# Patient Record
Sex: Female | Born: 1995 | Race: Black or African American | Hispanic: No | Marital: Single | Smoking: Current every day smoker
Health system: Southern US, Community
[De-identification: ages and names within clinical notes are randomized; demographics above are authoritative.]

## PROBLEM LIST (undated history)

## (undated) DIAGNOSIS — A6000 Herpesviral infection of urogenital system, unspecified: Secondary | ICD-10-CM

## (undated) DIAGNOSIS — F32A Depression, unspecified: Secondary | ICD-10-CM

## (undated) DIAGNOSIS — F329 Major depressive disorder, single episode, unspecified: Secondary | ICD-10-CM

## (undated) HISTORY — PX: TONSILLECTOMY: SUR1361

---
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
## 2017-06-13 ENCOUNTER — Emergency Department
Admission: EM | Admit: 2017-06-13 | Discharge: 2017-06-14 | Disposition: A | Discharge status: Home / Self Care | Payer: Medicaid Other | Attending: Emergency Medicine | Admitting: Emergency Medicine

## 2017-06-13 ENCOUNTER — Other Ambulatory Visit: Payer: Self-pay

## 2017-06-13 ENCOUNTER — Emergency Department: Payer: Medicaid Other

## 2017-06-13 ENCOUNTER — Encounter: Payer: Self-pay | Admitting: Emergency Medicine

## 2017-06-13 DIAGNOSIS — Z79899 Other long term (current) drug therapy: Secondary | ICD-10-CM | POA: Diagnosis not present

## 2017-06-13 DIAGNOSIS — F17210 Nicotine dependence, cigarettes, uncomplicated: Secondary | ICD-10-CM | POA: Diagnosis not present

## 2017-06-13 DIAGNOSIS — F329 Major depressive disorder, single episode, unspecified: Secondary | ICD-10-CM | POA: Insufficient documentation

## 2017-06-13 DIAGNOSIS — R079 Chest pain, unspecified: Secondary | ICD-10-CM | POA: Diagnosis present

## 2017-06-13 DIAGNOSIS — N644 Mastodynia: Secondary | ICD-10-CM | POA: Diagnosis not present

## 2017-06-13 HISTORY — DX: Major depressive disorder, single episode, unspecified: F32.9

## 2017-06-13 HISTORY — DX: Depression, unspecified: F32.A

## 2017-06-13 HISTORY — DX: Herpesviral infection of urogenital system, unspecified: A60.00

## 2017-06-13 LAB — BASIC METABOLIC PANEL
ANION GAP: 7 (ref 5–15)
BUN: 14 mg/dL (ref 6–20)
CALCIUM: 9.4 mg/dL (ref 8.9–10.3)
CO2: 27 mmol/L (ref 22–32)
Chloride: 106 mmol/L (ref 101–111)
Creatinine, Ser: 0.82 mg/dL (ref 0.44–1.00)
GFR calc non Af Amer: >60 mL/min (ref >60)
Glucose, Bld: 95 mg/dL (ref 65–99)
Potassium: 3.8 mmol/L (ref 3.5–5.1)
Sodium: 140 mmol/L (ref 135–145)

## 2017-06-13 LAB — CBC
HCT: 40.7 % (ref 35.0–47.0)
HEMOGLOBIN: 13.1 g/dL (ref 12.0–16.0)
MCH: 27.4 pg (ref 26.0–34.0)
MCHC: 32.2 g/dL (ref 32.0–36.0)
MCV: 85.3 fL (ref 80.0–100.0)
Platelets: 237 10*3/uL (ref 150–440)
RBC: 4.77 MIL/uL (ref 3.80–5.20)
RDW: 14.2 % (ref 11.5–14.5)
WBC: 11.8 10*3/uL — ABNORMAL HIGH (ref 3.6–11.0)

## 2017-06-13 LAB — TROPONIN I

## 2017-06-13 LAB — POCT PREGNANCY, URINE: PREG TEST UR: NEGATIVE

## 2017-06-13 NOTE — ED Notes (Signed)
Pt reports hx of nipple pain for 4 days with redness and swelling - pain with shower water hitting nipples, 10/10 "cutting" pain, LMP April 10, denies birth control takes IBU and valacyclovir daily for herpes outbreak in vagina, pt with occasional unprotected sex,   Pt reports CP for the last 2-3 months, worse with palpation, but no pain att  PT WOULD LIKE WORK NOTE

## 2017-06-13 NOTE — ED Triage Notes (Addendum)
Pt reports chest pain for 2-3 months but started worsening 4 days ago; pain to the center of her chest; pt also reports pain and swelling to both nipples for several weeks; no redness noted; denies drainage; pt has had unprotected intercourse since her LMP in April; pt talking in complete coherent sentences

## 2017-06-14 LAB — TROPONIN I: Troponin I: <0.03 ng/mL (ref <0.03)

## 2017-06-14 MED ORDER — FAMOTIDINE 40 MG PO TABS: 40.0000 mg | ORAL_TABLET | Freq: Every evening | ORAL | 0 refills | Status: DC

## 2017-06-14 MED ORDER — ACETAMINOPHEN 325 MG PO TABS
650.0000 mg | ORAL_TABLET | Freq: Once | ORAL | Status: AC
  Administered 2017-06-14: 650 mg via ORAL
  Filled 2017-06-14: qty 2

## 2017-06-14 MED ORDER — GI COCKTAIL ~~LOC~~
30.0000 mL | Freq: Once | ORAL | Status: AC
  Administered 2017-06-14: 30 mL via ORAL
  Filled 2017-06-14: qty 30

## 2017-06-14 MED ORDER — SUCRALFATE 1 G PO TABS
1.0000 g | ORAL_TABLET | Freq: Once | ORAL | Status: AC
  Administered 2017-06-14: 1 g via ORAL
  Filled 2017-06-14: qty 1

## 2017-06-14 NOTE — ED Provider Notes (Signed)
[facility] Emergency Department Provider Note   ____________________________________________   First MD Initiated Contact with Patient 06/13/17 2335     (approximate)  I have reviewed the triage vital signs and the nursing notes.   HISTORY  Chief Complaint Chest Pain    HPI Maria Raymond is a 22 y.o. female who comes into the hospital today with some chest pain and nipple swelling and pain.  She reports that the chest pain has been going on for months intermittently but the nipple swelling has been for the past 4 to 5 days.  The patient has been taking ibuprofen 800 mg with no relief.  She reports that her nipples hurt even when she takes a shower.  She denies any discharge or drainage from her nipples.  She states that her menstrual cycle is due to begin on May 10.  She states that with the chest pain she does not have any radiation although she has low back pain as well.  The patient states that her back pain is in her low to mid back.  She denies any shortness of breath and denies any pain is worse with breathing.  The patient states that she is also use ice packs to her nipples.  She is just unable to sleep.  She left work to come in for evaluation.  She is concerned because there is a family history of breast cancer.  The patient endorses some nausea and states the chest pain is worse when she lays down.  The patient rates her pain 8-10 out of 10 in intensity she is here today for evaluation.   Past Medical History:  Diagnosis Date  . Depression   . Genital herpes     There are no active problems to display for this patient.   Past Surgical History:  Procedure Laterality Date  . TONSILLECTOMY      Prior to Admission medications   Medication Sig Start Date End Date Taking? Authorizing Provider  ibuprofen (ADVIL,MOTRIN) 800 MG tablet Take 800 mg by mouth every 8 (eight) hours as needed.   Yes [provider]  valACYclovir (VALTREX) 1000 MG  tablet Take 1,000 mg by mouth daily.   Yes [provider]  famotidine (PEPCID) 40 MG tablet Take 1 tablet (40 mg total) by mouth every evening. 06/14/17 06/14/18  Rebecka Apley, MD    Allergies Patient has no known allergies.  History reviewed. No pertinent family history.  Social History Social History   Tobacco Use  . Smoking status: Current Every Day Smoker    Packs/day: 0.25    Types: Cigarettes  . Smokeless tobacco: Never Used  Substance Use Topics  . Alcohol use: Yes  . Drug use: Never    Review of Systems  Constitutional: No fever/chills Eyes: No visual changes. ENT: No sore throat. Cardiovascular:  chest pain. Respiratory: Denies shortness of breath. Gastrointestinal: No abdominal pain.  No nausea, no vomiting.  No diarrhea.  No constipation. Genitourinary: Negative for dysuria. Musculoskeletal: Negative for back pain. Skin: Nipple pain Neurological: Negative for headaches, focal weakness or numbness.   ____________________________________________   PHYSICAL EXAM:  VITAL SIGNS: ED Triage Vitals [06/13/17 2050]  Enc Vitals Group     BP 133/78     Pulse Rate 85     Resp 17     Temp 98.8 F (37.1 C)     Temp Source Oral     SpO2 100 %     Weight 180 lb (  81.6 kg)     Height  (1.651 m)     Head Circumference      Peak Flow      Pain Score 7     Pain Loc      Pain Edu?      Excl. in GC?     Constitutional: Alert and oriented. Well appearing and in mild distress. Eyes: Conjunctivae are normal. PERRL. EOMI. Head: Atraumatic. Nose: No congestion/rhinnorhea. Mouth/Throat: Mucous membranes are moist.  Oropharynx non-erythematous. Cardiovascular: Normal rate, regular rhythm. Grossly normal heart sounds.  Good peripheral circulation. Respiratory: Normal respiratory effort.  No retractions. Lungs CTAB. Gastrointestinal: Soft and nontender. No distention.  Positive bowel sounds Musculoskeletal: No lower extremity tenderness nor edema.     Neurologic:  Normal speech and language.  Skin: No breast masses palpated some mild tenderness over the nipples with no active discharge. Psychiatric: Mood and affect are normal.   ____________________________________________   LABS (all labs ordered are listed, but only abnormal results are displayed)  Labs Reviewed  CBC - Abnormal; Notable for the following components:      Result Value   WBC 11.8 (*)    All other components within normal limits  BASIC METABOLIC PANEL  TROPONIN I  TROPONIN I  POC URINE PREG, ED  POCT PREGNANCY, URINE   ____________________________________________  EKG  ED ECG REPORT I, Rebecka Apley, the attending physician, personally viewed and interpreted this ECG.   Date: 06/13/2017  EKG Time: 2047  Rate: 88  Rhythm: normal sinus rhythm  Axis: normal  Intervals:none  ST&T Change: none  ____________________________________________  RADIOLOGY  ED MD interpretation: Chest x-ray: No active cardiopulmonary disease  Official radiology report(s): Dg Chest 2 View  Result Date: 06/13/2017 CLINICAL DATA:  Chest pain. EXAM: CHEST - 2 VIEW COMPARISON:  None. FINDINGS: The heart size and mediastinal contours are within normal limits. Both lungs are clear. No pneumothorax or pleural effusion is noted. The visualized skeletal structures are unremarkable. IMPRESSION: No active cardiopulmonary disease. Electronically Signed   By: Lupita Raider, M.D.   On: 06/13/2017 21:31    ____________________________________________   PROCEDURES  Procedure(s) performed: None  Procedures  Critical Care performed: No  ____________________________________________   INITIAL IMPRESSION / ASSESSMENT AND PLAN / ED COURSE  As part of my medical decision making, I reviewed the following data within the electronic MEDICAL RECORD NUMBER Notes from prior ED visits and [place] Controlled Substance Database   This is a 22 year old female who comes into the hospital today  with some chest pain and nipple pain.  My differential diagnosis includes gastritis, acute coronary syndrome, pneumonia, pregnancy, PMS.  We did check some blood work to include a CBC BMP troponin and a pregnancy test.  The patient's blood work is unremarkable and the pregnancy is negative.  We did repeat her troponin which was also negative.  I discussed with the patient that this could be due to hormones or could be an early pregnancy that is not yet detected by urine.  The patient had a chest x-ray as well which was negative.  We discussed that the chest pain could be gastritis and I did give her a GI cocktail and Pepcid.  The patient symptoms improved and she is ready to be discharged home.  The patient will be discharged to follow-up with her primary care physician.  I encouraged the patient to repeat her pregnancy test at home in 1 week to determine if she is pregnant.  ____________________________________________   FINAL CLINICAL IMPRESSION(S) / ED DIAGNOSES  Final diagnoses:  Chest pain, unspecified type  Breast tenderness in female     ED Discharge Orders        Ordered    famotidine (PEPCID) 40 MG tablet  Every evening     06/14/17 0040       Note:  This document was prepared using Dragon voice recognition software and may include unintentional dictation errors.    Rebecka Apley, MD 06/14/17 [phone]

## 2017-06-14 NOTE — Discharge Instructions (Addendum)
Please follow up with your primary care physician for further evaluation of your chest and breast pain. Please return with any other concerns

## 2018-01-10 ENCOUNTER — Encounter: Payer: Self-pay | Admitting: Physician Assistant

## 2018-01-10 ENCOUNTER — Emergency Department: Payer: Medicaid Other

## 2018-01-10 ENCOUNTER — Emergency Department
Admission: EM | Admit: 2018-01-10 | Discharge: 2018-01-10 | Disposition: A | Discharge status: Home / Self Care | Payer: Medicaid Other | Attending: Emergency Medicine | Admitting: Emergency Medicine

## 2018-01-10 ENCOUNTER — Other Ambulatory Visit: Payer: Self-pay

## 2018-01-10 DIAGNOSIS — Y939 Activity, unspecified: Secondary | ICD-10-CM | POA: Diagnosis not present

## 2018-01-10 DIAGNOSIS — Y929 Unspecified place or not applicable: Secondary | ICD-10-CM | POA: Diagnosis not present

## 2018-01-10 DIAGNOSIS — Y998 Other external cause status: Secondary | ICD-10-CM | POA: Insufficient documentation

## 2018-01-10 DIAGNOSIS — F17210 Nicotine dependence, cigarettes, uncomplicated: Secondary | ICD-10-CM | POA: Insufficient documentation

## 2018-01-10 DIAGNOSIS — S93402A Sprain of unspecified ligament of left ankle, initial encounter: Secondary | ICD-10-CM | POA: Insufficient documentation

## 2018-01-10 DIAGNOSIS — W182XXA Fall in (into) shower or empty bathtub, initial encounter: Secondary | ICD-10-CM | POA: Diagnosis not present

## 2018-01-10 DIAGNOSIS — S99912A Unspecified injury of left ankle, initial encounter: Secondary | ICD-10-CM | POA: Diagnosis present

## 2018-01-10 DIAGNOSIS — Z79899 Other long term (current) drug therapy: Secondary | ICD-10-CM | POA: Insufficient documentation

## 2018-01-10 DIAGNOSIS — S90122A Contusion of left lesser toe(s) without damage to nail, initial encounter: Secondary | ICD-10-CM | POA: Diagnosis not present

## 2018-01-10 MED ORDER — NABUMETONE 750 MG PO TABS: 750.0000 mg | ORAL_TABLET | Freq: Two times a day (BID) | ORAL | 0 refills | Status: DC

## 2018-01-10 NOTE — ED Triage Notes (Signed)
PT in with co left ankle pain for months, unsure of injury. Pt states worse when she stands or walks.

## 2018-01-10 NOTE — Discharge Instructions (Signed)
You have a normal exam and x-ray. You have a high ankle sprain. Wear the ace bandage as needed for support. Rest, ice,and elevate the foot when seated. Take the prescription pain medicine as needed. Follow-up with podiatry for ongoing symptoms.

## 2018-01-10 NOTE — ED Notes (Signed)

## 2018-01-14 NOTE — ED Provider Notes (Signed)
[facility] Emergency Department Provider Note ____________________________________________  Time seen: 2110  I have reviewed the triage vital signs and the nursing notes.  HISTORY  Chief Complaint  Ankle Pain  HPI Maria Raymond is a 22 y.o. female presents to the ED with complaints of left ankle pain for months.  Patient is unsure of any recent injury, accident, trauma.  She describes pain is worsened when she stands or walks for prolonged periods.  She does admit to remote injury where she slipped and fell in the shower.  She has had some pain and swelling to the anterior shin that is been persistent for the last several weeks.  She denies any history of chronic or ongoing ankle or joint pain.  Past Medical History:  Diagnosis Date  . Depression   . Genital herpes     There are no active problems to display for this patient.   Past Surgical History:  Procedure Laterality Date  . TONSILLECTOMY      Prior to Admission medications   Medication Sig Start Date End Date Taking? Authorizing Provider  famotidine (PEPCID) 40 MG tablet Take 1 tablet (40 mg total) by mouth every evening. 06/14/17 06/14/18  Rebecka ApleyWebster, Allison P, MD  ibuprofen (ADVIL,MOTRIN) 800 MG tablet Take 800 mg by mouth every 8 (eight) hours as needed.    [provider]  nabumetone (RELAFEN) 750 MG tablet Take 1 tablet (750 mg total) by mouth 2 (two) times daily. 01/10/18   Donyae Kohn, Charlesetta IvoryJenise V Bacon, PA-C  valACYclovir (VALTREX) 1000 MG tablet Take 1,000 mg by mouth daily.    [provider]    Allergies Patient has no known allergies.  No family history on file.  Social History Social History   Tobacco Use  . Smoking status: Current Every Day Smoker    Packs/day: 0.25    Types: Cigarettes  . Smokeless tobacco: Never Used  Substance Use Topics  . Alcohol use: Yes  . Drug use: Never    Review of Systems  Constitutional: Negative for fever. Cardiovascular: Negative for  chest pain. Respiratory: Negative for shortness of breath. Musculoskeletal: Negative for back pain.  Left ankle pain as above. Skin: Negative for rash. Neurological: Negative for headaches, focal weakness or numbness. ____________________________________________  PHYSICAL EXAM:  VITAL SIGNS: ED Triage Vitals  Enc Vitals Group     BP 01/10/18 1937 (!) 113/95     Pulse Rate 01/10/18 1937 (!) 108     Resp 01/10/18 1937 20     Temp 01/10/18 1937 98.9 F (37.2 C)     Temp Source 01/10/18 1937 Oral     SpO2 01/10/18 1937 100 %     Weight 01/10/18 1938 160 lb (72.6 kg)     Height 01/10/18 1938 5\' 5"  (1.651 m)     Head Circumference --      Peak Flow --      Pain Score 01/10/18 1938 8     Pain Loc --      Pain Edu? --      Excl. in GC? --     Constitutional: Alert and oriented. Well appearing and in no distress. Head: Normocephalic and atraumatic. Eyes: Conjunctivae are normal.  Normal extraocular movements Cardiovascular: Normal rate, regular rhythm. Normal distal pulses. Respiratory: Normal respiratory effort.  Musculoskeletal: Ankle without any obvious deformity, dislocation, or joint effusion.  Patient localizes some tenderness and mild bruising to the anterior lower shin.  There is an area of some soft tissue swelling  that may indicate a local injury.  Otherwise the ankle exam benign without any indication of internal derangement.  Negative anterior/posterior drawer.  No significant calf or Achilles tenderness is appreciated.  Knee exam is also reassuring.  Nontender with normal range of motion in all extremities.  Neurologic:  Normal gait without ataxia. Normal speech and language. No gross focal neurologic deficits are appreciated. Skin:  Skin is warm, dry and intact. No rash noted. ____________________________________________   RADIOLOGY  Left Ankle IMPRESSION: Negative. ____________________________________________  PROCEDURES  Procedures Ace  wrap ____________________________________________  INITIAL IMPRESSION / ASSESSMENT AND PLAN / ED COURSE  Patient with ED evaluation of chronic intermittent left ankle pain.  Patient exam is overall benign without any signs of internal derangement to the left ankle.  She may have a local injury to the upper anterior shin consistent with a local contusion.  She is reassured overall by her exam and x-ray findings.  She placed in Ace bandage for support, and reports improvement of her symptoms with the Ace in place.  Patient is discharged with prescription for Relafen to take as directed patient is also advised to rest, ice, elevate the foot as needed.  She will follow-up with podiatry for ongoing symptom management. ____________________________________________  FINAL CLINICAL IMPRESSION(S) / ED DIAGNOSES  Final diagnoses:  Sprain of left ankle, unspecified ligament, initial encounter  Contusion of fifth toe of left foot, initial encounter      Lissa Hoard, PA-C 01/14/18 0043    Arnaldo Natal, MD 01/14/18 [phone]

## 2018-02-14 ENCOUNTER — Encounter: Payer: Self-pay | Admitting: Intensive Care

## 2018-02-14 ENCOUNTER — Other Ambulatory Visit: Payer: Self-pay

## 2018-02-14 ENCOUNTER — Emergency Department
Admission: EM | Admit: 2018-02-14 | Discharge: 2018-02-14 | Disposition: A | Discharge status: Home / Self Care | Payer: Medicaid Other | Attending: Emergency Medicine | Admitting: Emergency Medicine

## 2018-02-14 DIAGNOSIS — F17210 Nicotine dependence, cigarettes, uncomplicated: Secondary | ICD-10-CM | POA: Insufficient documentation

## 2018-02-14 DIAGNOSIS — Z79899 Other long term (current) drug therapy: Secondary | ICD-10-CM | POA: Diagnosis not present

## 2018-02-14 DIAGNOSIS — J101 Influenza due to other identified influenza virus with other respiratory manifestations: Secondary | ICD-10-CM | POA: Insufficient documentation

## 2018-02-14 DIAGNOSIS — R05 Cough: Secondary | ICD-10-CM | POA: Diagnosis present

## 2018-02-14 DIAGNOSIS — J111 Influenza due to unidentified influenza virus with other respiratory manifestations: Secondary | ICD-10-CM

## 2018-02-14 MED ORDER — BENZONATATE 100 MG PO CAPS: ORAL_CAPSULE | ORAL | 0 refills | Status: DC

## 2018-02-14 MED ORDER — FLUTICASONE PROPIONATE 50 MCG/ACT NA SUSP: 2.0000 | Freq: Every day | NASAL | 0 refills | Status: DC

## 2018-02-14 MED ORDER — ONDANSETRON 4 MG PO TBDP: 4.0000 mg | ORAL_TABLET | Freq: Three times a day (TID) | ORAL | 0 refills | Status: DC | PRN

## 2018-02-14 NOTE — ED Notes (Signed)
Reference triage notes. Pt states fever yesterday was 103. Pt c/o nausea at this time and light-headedness. Pt in NAD at this time. Pt denies abdominal pain presently.

## 2018-02-14 NOTE — ED Triage Notes (Signed)
Patient c/o flu like symptoms of fever, chills, and cough

## 2018-02-14 NOTE — ED Provider Notes (Signed)
[facility] Emergency Department Provider Note ____________________________________________  Time seen: 1953  I have reviewed the triage vital signs and the nursing notes.  HISTORY  Chief Complaint  Influenza  HPI Maria Raymond is a 23 y.o. female to the ED for evaluation of flulike symptoms.  Patient describes 3 days prior she had T-max of 42 F.  She is had intermittent cough, congestion, body aches, and nausea.  She is also noted some diarrhea in the interim.  Patient did not receive the seasonal flu vaccine.  She presents now for evaluation of her symptoms and for confirmation of her presumed flu.  She misses her grandmother was recently diagnosed with the flu, and she was in her grandmother's house over the last 3 days.  Past Medical History:  Diagnosis Date  . Depression   . Genital herpes     There are no active problems to display for this patient.   Past Surgical History:  Procedure Laterality Date  . TONSILLECTOMY      Prior to Admission medications   Medication Sig Start Date End Date Taking? Authorizing Provider  benzonatate (TESSALON PERLES) 100 MG capsule Take 1-2 tabs TID prn cough 02/14/18   Keghan Mcfarren, Charlesetta Ivory, PA-C  famotidine (PEPCID) 40 MG tablet Take 1 tablet (40 mg total) by mouth every evening. 06/14/17 06/14/18  Rebecka Apley, MD  fluticasone (FLONASE) 50 MCG/ACT nasal spray Place 2 sprays into both nostrils daily. 02/14/18   Deray Dawes, Charlesetta Ivory, PA-C  ibuprofen (ADVIL,MOTRIN) 800 MG tablet Take 800 mg by mouth every 8 (eight) hours as needed.    [provider]  nabumetone (RELAFEN) 750 MG tablet Take 1 tablet (750 mg total) by mouth 2 (two) times daily. 01/10/18   Samba Cumba, Charlesetta Ivory, PA-C  ondansetron (ZOFRAN ODT) 4 MG disintegrating tablet Take 1 tablet (4 mg total) by mouth every 8 (eight) hours as needed. 02/14/18   Tytianna Greenley, Charlesetta Ivory, PA-C  valACYclovir (VALTREX) 1000 MG tablet Take 1,000 mg by mouth daily.     [provider]    Allergies Patient has no known allergies.  History reviewed. No pertinent family history.  Social History Social History   Tobacco Use  . Smoking status: Current Every Day Smoker    Packs/day: 0.25    Types: Cigarettes  . Smokeless tobacco: Never Used  Substance Use Topics  . Alcohol use: Yes  . Drug use: Never    Review of Systems  Constitutional: Positive for fever. Eyes: Negative for visual changes. ENT: Negative for sore throat. Cardiovascular: Negative for chest pain. Respiratory: Negative for shortness of breath.  Reports cough as above. Gastrointestinal: Negative for abdominal pain, vomiting and diarrhea. Genitourinary: Negative for dysuria. Musculoskeletal: Negative for back pain.  Reports generalized body aches. Skin: Negative for rash. Neurological: Negative for headaches, focal weakness or numbness. ____________________________________________  PHYSICAL EXAM:  VITAL SIGNS: ED Triage Vitals [02/14/18 1853]  Enc Vitals Group     BP 131/82     Pulse Rate 75     Resp 16     Temp 98.4 F (36.9 C)     Temp Source Oral     SpO2 99 %     Weight 160 lb (72.6 kg)     Height 5\' 5"  (1.651 m)     Head Circumference      Peak Flow      Pain Score 8     Pain Loc      Pain Edu?  Excl. in GC?     Constitutional: Alert and oriented. Well appearing and in no distress. Head: Normocephalic and atraumatic. Eyes: Conjunctivae are normal. PERRL. Normal extraocular movements Nose: No congestion/rhinorrhea/epistaxis. Mouth/Throat: Mucous membranes are moist. Cardiovascular: Normal rate, regular rhythm. Normal distal pulses. Respiratory: Normal respiratory effort. No wheezes/rales/rhonchi. Gastrointestinal: Soft and nontender. No distention. Musculoskeletal: Nontender with normal range of motion in all extremities.  Neurologic:  Normal gait without ataxia. Normal speech and language. No gross focal neurologic deficits are  appreciated. Skin:  Skin is warm, dry and intact. No rash noted. ____________________________________________   LABS (pertinent positives/negatives)  deferred ____________________________________________  PROCEDURES  Procedures ____________________________________________  INITIAL IMPRESSION / ASSESSMENT AND PLAN / ED COURSE  Patient with a clinical picture of symptoms likely consistent with influenza.  Patient is T-max occurred 3 days prior, and she is beyond the window of treatment for Tamiflu.  Patient verbalizes understanding, and is not inclined to wait for influenza testing confirmation given the long wait to get into the room and the delay in getting results.  She is advised that her symptoms are classic for influenza, and treatment is symptomatic at this point.  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  She is discharged with prescriptions for [facility], Flonase, and Zofran.  Return precautions have been reviewed. ____________________________________________  FINAL CLINICAL IMPRESSION(S) / ED DIAGNOSES  Final diagnoses:  Influenza      Karmen Stabs, Charlesetta Ivory, PA-C 02/14/18 2152    Arnaldo Natal, MD 02/15/18 [phone]

## 2018-02-14 NOTE — Discharge Instructions (Addendum)
Your symptoms are consistent with influenza. You should take the prescription meds as directed. Take OTC Delsym for cough relief. Drink plenty of fluids and rest. Consider getting the flu vaccine after your are beyond this current illness. Follow-up with your provider or return as needed.

## 2018-03-22 ENCOUNTER — Encounter: Payer: Self-pay | Admitting: *Deleted

## 2018-03-22 ENCOUNTER — Emergency Department: Payer: Medicaid Other

## 2018-03-22 ENCOUNTER — Emergency Department
Admission: EM | Admit: 2018-03-22 | Discharge: 2018-03-22 | Disposition: A | Discharge status: Home / Self Care | Payer: Medicaid Other | Attending: Emergency Medicine | Admitting: Emergency Medicine

## 2018-03-22 ENCOUNTER — Other Ambulatory Visit: Payer: Self-pay

## 2018-03-22 DIAGNOSIS — W01198A Fall on same level from slipping, tripping and stumbling with subsequent striking against other object, initial encounter: Secondary | ICD-10-CM | POA: Insufficient documentation

## 2018-03-22 DIAGNOSIS — Y92012 Bathroom of single-family (private) house as the place of occurrence of the external cause: Secondary | ICD-10-CM | POA: Diagnosis not present

## 2018-03-22 DIAGNOSIS — Z79899 Other long term (current) drug therapy: Secondary | ICD-10-CM | POA: Diagnosis not present

## 2018-03-22 DIAGNOSIS — S6992XA Unspecified injury of left wrist, hand and finger(s), initial encounter: Secondary | ICD-10-CM | POA: Diagnosis present

## 2018-03-22 DIAGNOSIS — F17210 Nicotine dependence, cigarettes, uncomplicated: Secondary | ICD-10-CM | POA: Diagnosis not present

## 2018-03-22 DIAGNOSIS — Y998 Other external cause status: Secondary | ICD-10-CM | POA: Insufficient documentation

## 2018-03-22 DIAGNOSIS — Y93E1 Activity, personal bathing and showering: Secondary | ICD-10-CM | POA: Insufficient documentation

## 2018-03-22 DIAGNOSIS — S60222A Contusion of left hand, initial encounter: Secondary | ICD-10-CM | POA: Diagnosis not present

## 2018-03-22 NOTE — ED Provider Notes (Signed)
[facility] EMERGENCY DEPARTMENT Provider Note   CSN: [id number] Arrival date & time: 03/22/18  2127     History   Chief Complaint Chief Complaint  Patient presents with  . Hand Injury    HPI Maria Raymond is a 23 y.o. female presents to the emergency department evaluation of left hand pain.  Patient states last night she tripped and fell in the shower injuring her left hand.  She states she hit the palmar aspect of the hand.  She denies any other injury to her body.  She has pain to the third fourth and fifth metacarpal of the left hand.  No numbness or tingling.  No warmth or redness.  HPI  Past Medical History:  Diagnosis Date  . Depression   . Genital herpes     There are no active problems to display for this patient.   Past Surgical History:  Procedure Laterality Date  . TONSILLECTOMY       OB History   No obstetric history on file.      Home Medications    Prior to Admission medications   Medication Sig Start Date End Date Taking? Authorizing Provider  benzonatate (TESSALON PERLES) 100 MG capsule Take 1-2 tabs TID prn cough 02/14/18   Menshew, Charlesetta Ivory, PA-C  famotidine (PEPCID) 40 MG tablet Take 1 tablet (40 mg total) by mouth every evening. 06/14/17 06/14/18  Rebecka Apley, MD  fluticasone (FLONASE) 50 MCG/ACT nasal spray Place 2 sprays into both nostrils daily. 02/14/18   Menshew, Charlesetta Ivory, PA-C  ibuprofen (ADVIL,MOTRIN) 800 MG tablet Take 800 mg by mouth every 8 (eight) hours as needed.    [provider]  nabumetone (RELAFEN) 750 MG tablet Take 1 tablet (750 mg total) by mouth 2 (two) times daily. 01/10/18   Menshew, Charlesetta Ivory, PA-C  ondansetron (ZOFRAN ODT) 4 MG disintegrating tablet Take 1 tablet (4 mg total) by mouth every 8 (eight) hours as needed. 02/14/18   Menshew, Charlesetta Ivory, PA-C  valACYclovir (VALTREX) 1000 MG tablet Take 1,000 mg by mouth daily.    [provider]    Family History No  family history on file.  Social History Social History   Tobacco Use  . Smoking status: Current Every Day Smoker    Packs/day: 0.25    Types: Cigarettes  . Smokeless tobacco: Never Used  Substance Use Topics  . Alcohol use: Yes  . Drug use: Never     Allergies   Patient has no known allergies.   Review of Systems Review of Systems  Constitutional: Negative for fever.  Respiratory: Negative for shortness of breath.   Cardiovascular: Negative for chest pain.  Gastrointestinal: Negative for abdominal pain.  Genitourinary: Negative for urgency.  Musculoskeletal: Positive for arthralgias. Negative for back pain, joint swelling and myalgias.  Skin: Negative for rash.  Neurological: Negative for dizziness and headaches.     Physical Exam Updated Vital Signs BP 132/83 (BP Location: Left Arm)   Pulse 85   Temp 98.4 F (36.9 C) (Oral)   Resp 16   Ht 5\' 5"  (1.651 m)   Wt 81.6 kg   LMP 02/26/2018 (Exact Date)   SpO2 97%   BMI 29.95 kg/m   Physical Exam Constitutional:      Appearance: She is well-developed.  HENT:     Head: Normocephalic and atraumatic.  Eyes:     Conjunctiva/sclera: Conjunctivae normal.  Neck:     Musculoskeletal: Normal range  of motion.  Cardiovascular:     Rate and Rhythm: Normal rate.  Pulmonary:     Effort: Pulmonary effort is normal. No respiratory distress.  Musculoskeletal: Normal range of motion.     Comments: Left hand shows no swelling warmth erythema.  She has full composite fist.  No angulation or rotation of digits.  Mild tenderness along the third fourth and fifth metacarpals.  Skin:    General: Skin is warm.     Findings: No rash.  Neurological:     Mental Status: She is alert and oriented to person, place, and time.  Psychiatric:        Behavior: Behavior normal.        Thought Content: Thought content normal.      ED Treatments / Results  Labs (all labs ordered are listed, but only abnormal results are displayed) Labs  Reviewed - No data to display  EKG None  Radiology Dg Hand Complete Left  Result Date: 03/22/2018 CLINICAL DATA:  Initial evaluation for acute trauma, fall. EXAM: LEFT HAND - COMPLETE 3+ VIEW COMPARISON:  None. FINDINGS: There is no evidence of fracture or dislocation. There is no evidence of arthropathy or other focal bone abnormality. Soft tissues are unremarkable. IMPRESSION: Negative. Electronically Signed   By: Rise Mu M.D.   On: 03/22/2018 22:01    Procedures Procedures (including critical care time)  Medications Ordered in ED Medications - No data to display   Initial Impression / Assessment and Plan / ED Course  I have reviewed the triage vital signs and the nursing notes.  Pertinent labs & imaging results that were available during my care of the patient were reviewed by me and considered in my medical decision making (see chart for details).     23 year old female with left hand contusion.  She will rest ice and elevate left hand.  Work on gentle digit range of motion.  Alternate Tylenol and ibuprofen as needed for pain.  Final Clinical Impressions(s) / ED Diagnoses   Final diagnoses:  Contusion of left hand, initial encounter    ED Discharge Orders    None       Ronnette Juniper 03/22/18 2259    Dionne Bucy, MD 03/23/18 [phone]

## 2018-03-22 NOTE — ED Triage Notes (Signed)
Pt reports fell out of shower last night injuring left hand; ace wrap in place; denies any other c/o or injuries

## 2018-03-22 NOTE — Discharge Instructions (Addendum)
Work on gentle digit range of motion.  Rest ice and elevate left hand.  Take Tylenol and ibuprofen as needed for pain.

## 2018-04-27 ENCOUNTER — Encounter: Payer: Self-pay | Admitting: Emergency Medicine

## 2018-04-27 ENCOUNTER — Other Ambulatory Visit: Payer: Self-pay

## 2018-04-27 ENCOUNTER — Emergency Department
Admission: EM | Admit: 2018-04-27 | Discharge: 2018-04-27 | Disposition: A | Discharge status: Home / Self Care | Payer: Medicaid Other | Attending: Emergency Medicine | Admitting: Emergency Medicine

## 2018-04-27 DIAGNOSIS — F17210 Nicotine dependence, cigarettes, uncomplicated: Secondary | ICD-10-CM | POA: Insufficient documentation

## 2018-04-27 DIAGNOSIS — R112 Nausea with vomiting, unspecified: Secondary | ICD-10-CM

## 2018-04-27 DIAGNOSIS — Z79899 Other long term (current) drug therapy: Secondary | ICD-10-CM | POA: Insufficient documentation

## 2018-04-27 MED ORDER — ONDANSETRON HCL 8 MG PO TABS: 8.0000 mg | ORAL_TABLET | Freq: Three times a day (TID) | ORAL | 0 refills | Status: DC | PRN

## 2018-04-27 MED ORDER — ONDANSETRON 4 MG PO TBDP
4.0000 mg | ORAL_TABLET | Freq: Once | ORAL | Status: AC | PRN
  Administered 2018-04-27: 4 mg via ORAL
  Filled 2018-04-27: qty 1

## 2018-04-27 NOTE — ED Notes (Signed)
Pt N/V for 2 days. Has not been drinking water, only juice and soda. NO diarrhea. PT has been vomiting oral intake. Headache on and off.

## 2018-04-27 NOTE — ED Provider Notes (Signed)
[facility] Emergency Department Provider Note   ____________________________________________   First MD Initiated Contact with Patient 04/27/18 1413     (approximate)  I have reviewed the triage vital signs and the nursing notes.   HISTORY  Chief Complaint Generalized Body Aches    HPI Maria Raymond is a 23 y.o. female patient complain of nausea and body aches.  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  Patient denies diarrhea.  Patient say her body ache is from the vomiting.  Patient denies diarrhea.  Patient has taken flu shot for this season.  Patient did have a positive flu test in January 2020 after taking the shot in November 2019.  Patient status testing today did not feel like the flu.  Patient denies recent travel or contact with any known recent travel.  Patient had a vomiting episode prior to arrival and was given Zofran in triage.          Past Medical History:  Diagnosis Date  . Depression   . Genital herpes     There are no active problems to display for this patient.   Past Surgical History:  Procedure Laterality Date  . TONSILLECTOMY      Prior to Admission medications   Medication Sig Start Date End Date Taking? Authorizing Provider  benzonatate (TESSALON PERLES) 100 MG capsule Take 1-2 tabs TID prn cough 02/14/18   Menshew, Charlesetta Ivory, PA-C  famotidine (PEPCID) 40 MG tablet Take 1 tablet (40 mg total) by mouth every evening. 06/14/17 06/14/18  Rebecka Apley, MD  fluticasone (FLONASE) 50 MCG/ACT nasal spray Place 2 sprays into both nostrils daily. 02/14/18   Menshew, Charlesetta Ivory, PA-C  ibuprofen (ADVIL,MOTRIN) 800 MG tablet Take 800 mg by mouth every 8 (eight) hours as needed.    [provider]  nabumetone (RELAFEN) 750 MG tablet Take 1 tablet (750 mg total) by mouth 2 (two) times daily. 01/10/18   Menshew, Charlesetta Ivory, PA-C  ondansetron (ZOFRAN ODT) 4 MG  disintegrating tablet Take 1 tablet (4 mg total) by mouth every 8 (eight) hours as needed. 02/14/18   Menshew, Charlesetta Ivory, PA-C  ondansetron (ZOFRAN) 8 MG tablet Take 1 tablet (8 mg total) by mouth every 8 (eight) hours as needed for nausea or vomiting. 04/27/18   Joni Reining, PA-C  valACYclovir (VALTREX) 1000 MG tablet Take 1,000 mg by mouth daily.    [provider]    Allergies Patient has no known allergies.  No family history on file.  Social History Social History   Tobacco Use  . Smoking status: Current Every Day Smoker    Packs/day: 0.25    Types: Cigarettes  . Smokeless tobacco: Never Used  Substance Use Topics  . Alcohol use: Yes  . Drug use: Never    Review of Systems Constitutional: No fever/chills Eyes: No visual changes. ENT: No sore throat. Cardiovascular: Denies chest pain. Respiratory: Denies shortness of breath. Gastrointestinal: No abdominal pain.  Nausea and vomiting.  No diarrhea.  No constipation. Genitourinary: Negative for dysuria. Musculoskeletal: Negative for back pain. Skin: Negative for rash. Neurological: Negative for headaches, focal weakness or numbness.   ____________________________________________   PHYSICAL EXAM:  VITAL SIGNS: ED Triage Vitals  Enc Vitals Group     BP 04/27/18 1354 (!) 143/81     Pulse Rate 04/27/18 1354 91     Resp 04/27/18 1354 15  Temp 04/27/18 1354 98.6 F (37 C)     Temp Source 04/27/18 1354 Oral     SpO2 04/27/18 1354 99 %     Weight 04/27/18 1354 153 lb (69.4 kg)     Height 04/27/18 1354 5\' 5"  (1.651 m)     Head Circumference --      Peak Flow --      Pain Score 04/27/18 1402 6     Pain Loc --      Pain Edu? --      Excl. in GC? --     Constitutional: Alert and oriented. Well appearing and in no acute distress. Hematological/Lymphatic/Immunilogical: No cervical lymphadenopathy. Cardiovascular: Normal rate, regular rhythm. Grossly normal heart sounds.  Good peripheral circulation.  Respiratory: Normal respiratory effort.  No retractions. Lungs CTAB. Gastrointestinal: Soft and nontender. No distention. No abdominal bruits. No CVA tenderness. Genitourinary: Currently menstruating. Musculoskeletal: No lower extremity tenderness nor edema.  No joint effusions. Neurologic:  Normal speech and language. No gross focal neurologic deficits are appreciated. No gait instability. Skin:  Skin is warm, dry and intact. No rash noted. Psychiatric: Mood and affect are normal. Speech and behavior are normal.  ____________________________________________   LABS (all labs ordered are listed, but only abnormal results are displayed)  Labs Reviewed - No data to display ____________________________________________  EKG   ____________________________________________  RADIOLOGY  ED MD interpretation:    Official radiology report(s): No results found.  ____________________________________________   PROCEDURES  Procedure(s) performed (including Critical Care):  Procedures   ____________________________________________   INITIAL IMPRESSION / ASSESSMENT AND PLAN / ED COURSE  As part of my medical decision making, I reviewed the following data within the electronic MEDICAL RECORD NUMBER         Patient presents with nausea and vomiting which was resolved status post Zofran.  Patient passed fluid challenge.  Patient given discharge care instructions and advised take medication as needed for nausea.  Return ER if condition worsens.      ____________________________________________   FINAL CLINICAL IMPRESSION(S) / ED DIAGNOSES  Final diagnoses:  Nausea and vomiting in adult     ED Discharge Orders         Ordered    ondansetron (ZOFRAN) 8 MG tablet  Every 8 hours PRN     04/27/18 1507           Note:  This document was prepared using Dragon voice recognition software and may include unintentional dictation errors.    Joni Reining, PA-C 04/27/18 1510     Jene Every, MD 04/27/18 1536

## 2018-04-27 NOTE — ED Triage Notes (Signed)
Pt arrives with complaints of generalized body aches and nausea that started yesterday.

## 2018-07-11 ENCOUNTER — Encounter: Payer: Self-pay | Admitting: Emergency Medicine

## 2018-07-11 ENCOUNTER — Emergency Department
Admission: EM | Admit: 2018-07-11 | Discharge: 2018-07-11 | Disposition: A | Discharge status: Home / Self Care | Payer: Medicaid Other | Attending: Emergency Medicine | Admitting: Emergency Medicine

## 2018-07-11 ENCOUNTER — Other Ambulatory Visit: Payer: Self-pay

## 2018-07-11 DIAGNOSIS — Z0279 Encounter for issue of other medical certificate: Secondary | ICD-10-CM | POA: Diagnosis not present

## 2018-07-11 DIAGNOSIS — Z20828 Contact with and (suspected) exposure to other viral communicable diseases: Secondary | ICD-10-CM | POA: Insufficient documentation

## 2018-07-11 DIAGNOSIS — R067 Sneezing: Secondary | ICD-10-CM | POA: Diagnosis not present

## 2018-07-11 DIAGNOSIS — R0981 Nasal congestion: Secondary | ICD-10-CM | POA: Diagnosis present

## 2018-07-11 DIAGNOSIS — F17210 Nicotine dependence, cigarettes, uncomplicated: Secondary | ICD-10-CM | POA: Diagnosis not present

## 2018-07-11 DIAGNOSIS — Z79899 Other long term (current) drug therapy: Secondary | ICD-10-CM | POA: Insufficient documentation

## 2018-07-11 NOTE — ED Notes (Signed)
Attempted to collect Covid test. Pt refused test and said she does not want to be tested. Pt reports she had a runny nose earlier this morning and called out of work so they made her come to the hospital to get checked out.

## 2018-07-11 NOTE — Discharge Instructions (Addendum)
Your seen in the ER today and diagnosed with sneezing.

## 2018-07-11 NOTE — ED Provider Notes (Addendum)
[facility] Emergency Department Provider Note   ____________________________________________   First MD Initiated Contact with Patient 07/11/18 2218     (approximate)  I have reviewed the triage vital signs and the nursing notes.   HISTORY  Chief Complaint Nasal Congestion    HPI Maria Raymond is a 23 y.o. female who apparently called out from work earlier today because she was feeling bad and sneezing a lot.  She says she ran out of her seasonal allergy prescription.  She has more prescriptions at the pharmacy but was not able to get there.  She needs a note to go back to work.  She denies any fever or coughing.  She says she feels better now after she took some DayQuil.  Because she needs a note to go back to work to say that she does not have coronavirus I will have to do the coronavirus test to her.     Past Medical History:  Diagnosis Date  . Depression   . Genital herpes     There are no active problems to display for this patient.   Past Surgical History:  Procedure Laterality Date  . TONSILLECTOMY      Prior to Admission medications   Medication Sig Start Date End Date Taking? Authorizing Provider  benzonatate (TESSALON PERLES) 100 MG capsule Take 1-2 tabs TID prn cough 02/14/18   Menshew, Charlesetta IvoryJenise V Bacon, PA-C  famotidine (PEPCID) 40 MG tablet Take 1 tablet (40 mg total) by mouth every evening. 06/14/17 06/14/18  Rebecka ApleyWebster, Allison P, MD  fluticasone (FLONASE) 50 MCG/ACT nasal spray Place 2 sprays into both nostrils daily. 02/14/18   Menshew, Charlesetta IvoryJenise V Bacon, PA-C  ibuprofen (ADVIL,MOTRIN) 800 MG tablet Take 800 mg by mouth every 8 (eight) hours as needed.    [provider]  nabumetone (RELAFEN) 750 MG tablet Take 1 tablet (750 mg total) by mouth 2 (two) times daily. 01/10/18   Menshew, Charlesetta IvoryJenise V Bacon, PA-C  ondansetron (ZOFRAN ODT) 4 MG disintegrating tablet Take 1 tablet (4 mg total) by mouth every 8 (eight) hours as needed. 02/14/18    Menshew, Charlesetta IvoryJenise V Bacon, PA-C  ondansetron (ZOFRAN) 8 MG tablet Take 1 tablet (8 mg total) by mouth every 8 (eight) hours as needed for nausea or vomiting. 04/27/18   Joni ReiningSmith, Ronald K, PA-C  valACYclovir (VALTREX) 1000 MG tablet Take 1,000 mg by mouth daily.    [provider]    Allergies Patient has no known allergies.  No family history on file.  Social History Social History   Tobacco Use  . Smoking status: Current Every Day Smoker    Packs/day: 0.25    Types: Cigarettes  . Smokeless tobacco: Never Used  Substance Use Topics  . Alcohol use: Yes  . Drug use: Never    Review of Systems  Constitutional: No fever/chills Eyes: No visual changes. ENT: No sore throat. Cardiovascular: Denies chest pain. Respiratory: Denies shortness of breath. Gastrointestinal: No abdominal pain.  No nausea, no vomiting.  No diarrhea.  No constipation. Genitourinary: Negative for dysuria. Musculoskeletal: Negative for back pain. Skin: Negative for rash. Neurological: Negative for headaches, focal weakness  ____________________________________________   PHYSICAL EXAM:  VITAL SIGNS: ED Triage Vitals  Enc Vitals Group     BP 07/11/18 2208 135/83     Pulse Rate 07/11/18 2208 79     Resp 07/11/18 2208 18     Temp 07/11/18 2208 98.6 F (37 C)     Temp Source  07/11/18 2208 Oral     SpO2 07/11/18 2208 99 %     Weight 07/11/18 2207 180 lb (81.6 kg)     Height 07/11/18 2207 5\' 5"  (1.651 m)     Head Circumference --      Peak Flow --      Pain Score 07/11/18 2207 0     Pain Loc --      Pain Edu? --      Excl. in GC? --     Constitutional: Alert and oriented. Well appearing and in no acute distress. Eyes: Conjunctivae are normal.  Head: Atraumatic. Nose: No congestion/rhinnorhea. Mouth/Throat: Mucous membranes are moist.  Oropharynx non-erythematous. Neck: No stridor.  Cardiovascular: Normal rate, regular rhythm. Grossly normal heart sounds.  Good peripheral circulation.  Respiratory: Normal respiratory effort.  No retractions. Lungs CTAB. Gastrointestinal: Soft and nontender. No distention. No abdominal bruits. No CVA tenderness. Musculoskeletal: No leg pain or edema Neurologic:  Normal speech and language. No gross focal neurologic deficits are appreciated.  Skin:  Skin is warm, dry and intact. No rash noted.   ____________________________________________   LABS (all labs ordered are listed, but only abnormal results are displayed)  Labs Reviewed  SARS CORONAVIRUS 2 (HOSPITAL ORDER, PERFORMED IN [facility])   ____________________________________________  EKG   ____________________________________________  RADIOLOGY  ED MD interpretation:    Official radiology report(s): No results found.  ____________________________________________   PROCEDURES  Procedure(s) performed (including Critical Care):  Procedures   ____________________________________________   INITIAL IMPRESSION / ASSESSMENT AND PLAN / ED COURSE  Patient with only sneezing no coughing or fever is unlikely she has coronavirus however because she needs a note to clear her for work I will have to do the test.  She says she is able to get more of her prescriptions at the pharmacy without a prescription because she has refills.     Maria Raymond was evaluated in Emergency Department on 07/11/2018 for the symptoms described in the history of present illness. She was evaluated in the context of the global COVID-19 pandemic, which necessitated consideration that the patient might be at risk for infection with the SARS-CoV-2 virus that causes COVID-19. Institutional protocols and algorithms that pertain to the evaluation of patients at risk for COVID-19 are in a state of rapid change based on information released by regulatory bodies including the CDC and federal and state organizations. These policies and algorithms were followed during the patient's care in the ED.    Using the COVID test.  She only needs a note that says she was in the emergency room today.  I have given her a note that says she was "seen in the ER today and diagnosed with sneezing."     ____________________________________________   FINAL CLINICAL IMPRESSION(S) / ED DIAGNOSES  Final diagnoses:  Sneezing     ED Discharge Orders    None       Note:  This document was prepared using Dragon voice recognition software and may include unintentional dictation errors.    Arnaldo Natal, MD 07/11/18 2240    Arnaldo Natal, MD 07/11/18 2242

## 2018-07-11 NOTE — ED Triage Notes (Signed)
Patient ambulatory to triage with steady gait, without difficulty or distress noted, mask in place; pt reports since this am having sneezing, congestion; st has hx seasonal allergies and ran out of meds but needs work note to return

## 2018-07-11 NOTE — ED Notes (Signed)
Dr. Malinda at the bedside for pt evaluation  

## 2018-07-11 NOTE — ED Provider Notes (Signed)
     Dionne Bucy, MD 07/11/18 [phone]

## 2018-07-14 ENCOUNTER — Emergency Department
Admission: EM | Admit: 2018-07-14 | Discharge: 2018-07-14 | Disposition: A | Discharge status: Home / Self Care | Payer: Medicaid Other | Attending: Student in an Organized Health Care Education/Training Program | Admitting: Student in an Organized Health Care Education/Training Program

## 2018-07-14 ENCOUNTER — Emergency Department: Payer: Medicaid Other

## 2018-07-14 ENCOUNTER — Other Ambulatory Visit: Payer: Self-pay

## 2018-07-14 DIAGNOSIS — J209 Acute bronchitis, unspecified: Secondary | ICD-10-CM | POA: Diagnosis not present

## 2018-07-14 DIAGNOSIS — Z20828 Contact with and (suspected) exposure to other viral communicable diseases: Secondary | ICD-10-CM | POA: Diagnosis not present

## 2018-07-14 DIAGNOSIS — R0602 Shortness of breath: Secondary | ICD-10-CM | POA: Insufficient documentation

## 2018-07-14 DIAGNOSIS — F17210 Nicotine dependence, cigarettes, uncomplicated: Secondary | ICD-10-CM | POA: Diagnosis not present

## 2018-07-14 DIAGNOSIS — M791 Myalgia, unspecified site: Secondary | ICD-10-CM | POA: Diagnosis not present

## 2018-07-14 DIAGNOSIS — Z79899 Other long term (current) drug therapy: Secondary | ICD-10-CM | POA: Diagnosis not present

## 2018-07-14 DIAGNOSIS — R05 Cough: Secondary | ICD-10-CM | POA: Diagnosis present

## 2018-07-14 DIAGNOSIS — R067 Sneezing: Secondary | ICD-10-CM | POA: Insufficient documentation

## 2018-07-14 MED ORDER — BENZONATATE 200 MG PO CAPS: 200.0000 mg | ORAL_CAPSULE | Freq: Three times a day (TID) | ORAL | 0 refills | Status: DC | PRN

## 2018-07-14 MED ORDER — AZITHROMYCIN 250 MG PO TABS: ORAL_TABLET | ORAL | 0 refills | Status: DC

## 2018-07-14 NOTE — ED Provider Notes (Signed)
[facility] Provider Note  ____________________________________________   First MD Initiated Contact with Patient 07/14/18 1704     (approximate)  I have reviewed the triage vital signs and the nursing notes.   HISTORY  Chief Complaint Cough    HPI Maria [facility] is a 23 y.o. female presents emergency department complaining of cough, sneezing and short of breath on exertion for 4 days.  She was seen here 2 days ago for her "sinuses ".  She was told to take over-the-counter medications.  She states that she is not had a fever but does have some body aches.  She has been taking NyQuil and DayQuil without relief.  She states she refused COVID test the other day because she was afraid.  She does work at a truck stop.    Past Medical History:  Diagnosis Date  . Depression   . Genital herpes     There are no active problems to display for this patient.   Past Surgical History:  Procedure Laterality Date  . TONSILLECTOMY      Prior to Admission medications   Medication Sig Start Date End Date Taking? Authorizing Provider  azithromycin (ZITHROMAX Z-PAK) 250 MG tablet 2 pills today then 1 pill a day for 4 days 07/14/18   Sherrie Mustache Roselyn Bering, PA-C  benzonatate (TESSALON) 200 MG capsule Take 1 capsule (200 mg total) by mouth 3 (three) times daily as needed for cough. 07/14/18   Child Campoy, Roselyn Bering, PA-C  famotidine (PEPCID) 40 MG tablet Take 1 tablet (40 mg total) by mouth every evening. 06/14/17 06/14/18  Rebecka Apley, MD  fluticasone (FLONASE) 50 MCG/ACT nasal spray Place 2 sprays into both nostrils daily. 02/14/18   Menshew, Charlesetta Ivory, PA-C  ibuprofen (ADVIL,MOTRIN) 800 MG tablet Take 800 mg by mouth every 8 (eight) hours as needed.    [provider]  nabumetone (RELAFEN) 750 MG tablet Take 1 tablet (750 mg total) by mouth 2 (two) times daily. 01/10/18   Menshew, Charlesetta Ivory, PA-C  ondansetron (ZOFRAN ODT) 4 MG disintegrating tablet  Take 1 tablet (4 mg total) by mouth every 8 (eight) hours as needed. 02/14/18   Menshew, Charlesetta Ivory, PA-C  ondansetron (ZOFRAN) 8 MG tablet Take 1 tablet (8 mg total) by mouth every 8 (eight) hours as needed for nausea or vomiting. 04/27/18   Joni Reining, PA-C  valACYclovir (VALTREX) 1000 MG tablet Take 1,000 mg by mouth daily.    [provider]    Allergies Patient has no known allergies.  History reviewed. No pertinent family history.  Social History Social History   Tobacco Use  . Smoking status: Current Every Day Smoker    Packs/day: 0.25    Types: Cigarettes  . Smokeless tobacco: Never Used  Substance Use Topics  . Alcohol use: Yes  . Drug use: Never    Review of Systems  Constitutional: No fever/chills Eyes: No visual changes. ENT: No sore throat.  Sinus pain or drainage Respiratory: Positive shortness of breath and cough Genitourinary: Negative for dysuria. Musculoskeletal: Negative for back pain. Skin: Negative for rash.    ____________________________________________   PHYSICAL EXAM:  VITAL SIGNS: ED Triage Vitals  Enc Vitals Group     BP 07/14/18 1734 132/81     Pulse Rate 07/14/18 1734 70     Resp 07/14/18 1734 18     Temp 07/14/18 1734 99.1 F (37.3 C)     Temp Source 07/14/18 1734  Oral     SpO2 07/14/18 1734 99 %     Weight 07/14/18 1736 180 lb (81.6 kg)     Height 07/14/18 1736 5\' 5"  (1.651 m)     Head Circumference --      Peak Flow --      Pain Score 07/14/18 1736 0     Pain Loc --      Pain Edu? --      Excl. in GC? --     Constitutional: Alert and oriented. Well appearing and in no acute distress. Eyes: Conjunctivae are normal.  Head: Atraumatic. Nose: No congestion/rhinnorhea. Mouth/Throat: Mucous membranes are moist.   Neck:  supple no lymphadenopathy noted Cardiovascular: Normal rate, regular rhythm. Heart sounds are normal Respiratory: Normal respiratory effort.  No retractions, lungs c t a  Abd: soft nontender  bs normal all 4 quad GU: deferred Musculoskeletal: FROM all extremities, warm and well perfused Neurologic:  Normal speech and language.  Skin:  Skin is warm, dry and intact. No rash noted. Psychiatric: Mood and affect are normal. Speech and behavior are normal.  ____________________________________________   LABS (all labs ordered are listed, but only abnormal results are displayed)  Labs Reviewed  NOVEL CORONAVIRUS, NAA (HOSPITAL ORDER, SEND-OUT TO REF LAB)   ____________________________________________   ____________________________________________  RADIOLOGY  Chest x-ray is negative  ____________________________________________   PROCEDURES  Procedure(s) performed: No  Procedures    ____________________________________________   INITIAL IMPRESSION / ASSESSMENT AND PLAN / ED COURSE  Pertinent labs & imaging results that were available during my care of the patient were reviewed by me and considered in my medical decision making (see chart for details).   Patient is a 23 year old female presents emergency department complaint of cough, chest pain, shortness of breath.  Was seen here 2 days ago for her sinuses.  She refused COVID testing at that time.  Patient does work at a truck stop.  Physical exam shows patients appear very well.  All vitals are normal except low-grade temp at 99 1.  Chest x-ray, COVID-19 test    ----------------------------------------- 8:09 PM on 07/14/2018 -----------------------------------------  Chest x-ray is normal  Due to the patient's low-grade temp along with bronchial symptoms she was started on a Z-Pak and Tessalon Perles.  COVID-19 test showed result in 2 days.  She was given a work note keeping her quarantined at home until she reached receives a negative COVID test.  Patient states she understands all instructions and was discharged stable condition.  [name] was evaluated in Emergency Department on 07/14/2018 for the  symptoms described in the history of present illness. She was evaluated in the context of the global COVID-19 pandemic, which necessitated consideration that the patient might be at risk for infection with the SARS-CoV-2 virus that causes COVID-19. Institutional protocols and algorithms that pertain to the evaluation of patients at risk for COVID-19 are in a state of rapid change based on information released by regulatory bodies including the CDC and federal and state organizations. These policies and algorithms were followed during the patient's care in the ED.   As part of my medical decision making, I reviewed the following data within the electronic MEDICAL RECORD NUMBER Nursing notes reviewed and incorporated, Old chart reviewed, Radiograph reviewed chest x-ray is normal, Notes from prior ED visits and [place] Controlled Substance Database  ____________________________________________   FINAL CLINICAL IMPRESSION(S) / ED DIAGNOSES  Final diagnoses:  Acute bronchitis, unspecified organism      NEW MEDICATIONS STARTED  DURING THIS VISIT:  Discharge Medication List as of 07/14/2018  7:53 PM    START taking these medications   Details  azithromycin (ZITHROMAX Z-PAK) 250 MG tablet 2 pills today then 1 pill a day for 4 days, Print         Note:  This document was prepared using Dragon voice recognition software and may include unintentional dictation errors.    Faythe Ghee, PA-C 07/14/18 2010    Willy Eddy, MD 07/14/18 2112

## 2018-07-14 NOTE — ED Notes (Signed)
Patient denies pain and is resting comfortably.  

## 2018-07-14 NOTE — Discharge Instructions (Addendum)
Take medications as prescribed.  Return emergency department worsening.  Your COVID test is pending and will should be resulted in 2 to 3 days. You should stay home until you have received a negative COVID test.

## 2018-07-14 NOTE — ED Triage Notes (Signed)
Pt reports cough, sneezing and SOB on exertion x 4 days. States she was here 2 days ago for "sinuses". Reports no fever or body aches. Has been taking nyquil and dayquil.

## 2018-07-16 LAB — NOVEL CORONAVIRUS, NAA (HOSP ORDER, SEND-OUT TO REF LAB; TAT 18-24 HRS): SARS-CoV-2, NAA: NOT DETECTED

## 2018-07-19 ENCOUNTER — Telehealth: Payer: Self-pay | Admitting: Emergency Medicine

## 2018-07-19 NOTE — Telephone Encounter (Signed)
Called patient and gave her covid 19 result.

## 2018-08-11 ENCOUNTER — Emergency Department
Admission: EM | Admit: 2018-08-11 | Discharge: 2018-08-11 | Disposition: A | Discharge status: Home / Self Care | Payer: Medicaid Other | Attending: Emergency Medicine | Admitting: Emergency Medicine

## 2018-08-11 ENCOUNTER — Other Ambulatory Visit: Payer: Self-pay

## 2018-08-11 ENCOUNTER — Encounter: Payer: Self-pay | Admitting: Emergency Medicine

## 2018-08-11 DIAGNOSIS — W01190A Fall on same level from slipping, tripping and stumbling with subsequent striking against furniture, initial encounter: Secondary | ICD-10-CM | POA: Insufficient documentation

## 2018-08-11 DIAGNOSIS — S0181XA Laceration without foreign body of other part of head, initial encounter: Secondary | ICD-10-CM

## 2018-08-11 DIAGNOSIS — F17210 Nicotine dependence, cigarettes, uncomplicated: Secondary | ICD-10-CM | POA: Diagnosis not present

## 2018-08-11 DIAGNOSIS — Y999 Unspecified external cause status: Secondary | ICD-10-CM | POA: Insufficient documentation

## 2018-08-11 DIAGNOSIS — Z79899 Other long term (current) drug therapy: Secondary | ICD-10-CM | POA: Insufficient documentation

## 2018-08-11 DIAGNOSIS — Y929 Unspecified place or not applicable: Secondary | ICD-10-CM | POA: Diagnosis not present

## 2018-08-11 DIAGNOSIS — Y9383 Activity, rough housing and horseplay: Secondary | ICD-10-CM | POA: Diagnosis not present

## 2018-08-11 DIAGNOSIS — S01111A Laceration without foreign body of right eyelid and periocular area, initial encounter: Secondary | ICD-10-CM | POA: Diagnosis not present

## 2018-08-11 DIAGNOSIS — S0993XA Unspecified injury of face, initial encounter: Secondary | ICD-10-CM | POA: Diagnosis present

## 2018-08-11 MED ORDER — CEPHALEXIN 500 MG PO CAPS: 1000.0000 mg | ORAL_CAPSULE | Freq: Two times a day (BID) | ORAL | 0 refills | Status: DC

## 2018-08-11 NOTE — ED Provider Notes (Signed)
[facility] Emergency Department Provider Note  ____________________________________________  Time seen: Approximately 7:18 PM  I have reviewed the triage vital signs and the nursing notes.   HISTORY  Chief Complaint Eye Injury    HPI [facility] is a 23 y.o. female who presents the emergency department for evaluation of a laceration to the corner of the right eye.  Patient reports that she was wrestling with her boyfriend last night over her phone when she fell and struck her face on an end table.  Patient sustained a laceration along the lateral aspect of the right eyebrow.  Bleeding was easily controlled.  Patient had no loss of consciousness.  Patient reports that she did not believe that she needed any medical treatment for this laceration.  Today family member told her that she may need stitches so she presents the emergency department.  Up-to-date on tetanus immunization.  She denies any headache or osseous facial pain.  Only complaint is laceration to the right face.         Past Medical History:  Diagnosis Date  . Depression   . Genital herpes     There are no active problems to display for this patient.   Past Surgical History:  Procedure Laterality Date  . TONSILLECTOMY      Prior to Admission medications   Medication Sig Start Date End Date Taking? Authorizing Provider  cephALEXin (KEFLEX) 500 MG capsule Take 2 capsules (1,000 mg total) by mouth 2 (two) times daily. 08/11/18   Gaylon Bentz, Charline Bills, PA-C  famotidine (PEPCID) 40 MG tablet Take 1 tablet (40 mg total) by mouth every evening. 06/14/17 06/14/18  Loney Hering, MD  fluticasone (FLONASE) 50 MCG/ACT nasal spray Place 2 sprays into both nostrils daily. 02/14/18   Menshew, Dannielle Karvonen, PA-C  ibuprofen (ADVIL,MOTRIN) 800 MG tablet Take 800 mg by mouth every 8 (eight) hours as needed.    [provider]    Allergies Patient has no known allergies.  History reviewed. No  pertinent family history.  Social History Social History   Tobacco Use  . Smoking status: Current Every Day Smoker    Packs/day: 0.25    Types: Cigarettes  . Smokeless tobacco: Never Used  Substance Use Topics  . Alcohol use: Yes  . Drug use: Never     Review of Systems  Constitutional: No fever/chills Eyes: No visual changes. No discharge ENT: No upper respiratory complaints. Cardiovascular: no chest pain. Respiratory: no cough. No SOB. Gastrointestinal: No abdominal pain.  No nausea, no vomiting.   Musculoskeletal: Negative for musculoskeletal pain. Skin: Positive for laceration to the right face Neurological: Negative for headaches, focal weakness or numbness. 10-point ROS otherwise negative.  ____________________________________________   PHYSICAL EXAM:  VITAL SIGNS: ED Triage Vitals  Enc Vitals Group     BP 08/11/18 1655 132/74     Pulse Rate 08/11/18 1655 93     Resp 08/11/18 1655 18     Temp 08/11/18 1655 98.7 F (37.1 C)     Temp Source 08/11/18 1655 Oral     SpO2 08/11/18 1655 99 %     Weight 08/11/18 1656 180 lb (81.6 kg)     Height 08/11/18 1656 5\' 5"  (1.651 m)     Head Circumference --      Peak Flow --      Pain Score 08/11/18 1656 8     Pain Loc --      Pain Edu? --  Excl. in GC? --      Constitutional: Alert and oriented. Well appearing and in no acute distress. Eyes: Conjunctivae are normal. PERRL. EOMI. Head: Visualization of the face reveals 2 cm gaping laceration running horizontally lateral to the right eyebrow.  No active bleeding.  Edges show evidence of granulation tissue.  Scabbed blood is appreciated.  No active bleeding.  No foreign body.  Mild surrounding edema.  No underlying tenderness to palpation along the orbital rim.  No palpable abnormality or crepitus. ENT:      Ears:       Nose: No congestion/rhinnorhea.      Mouth/Throat: Mucous membranes are moist.  Neck: No stridor.  No cervical spine tenderness to palpation.   Cardiovascular: Normal rate, regular rhythm. Normal S1 and S2.  Good peripheral circulation. Respiratory: Normal respiratory effort without tachypnea or retractions. Lungs CTAB. Good air entry to the bases with no decreased or absent breath sounds. Musculoskeletal: Full range of motion to all extremities. No gross deformities appreciated. Neurologic:  Normal speech and language. No gross focal neurologic deficits are appreciated.  Cranial nerves II through XII grossly intact. Skin:  Skin is warm, dry and intact. No rash noted. Psychiatric: Mood and affect are normal. Speech and behavior are normal. Patient exhibits appropriate insight and judgement.   ____________________________________________   LABS (all labs ordered are listed, but only abnormal results are displayed)  Labs Reviewed - No data to display ____________________________________________  EKG   ____________________________________________  RADIOLOGY   No results found.  ____________________________________________    PROCEDURES  Procedure(s) performed:    Procedures    Medications - No data to display   ____________________________________________   INITIAL IMPRESSION / ASSESSMENT AND PLAN / ED COURSE  Pertinent labs & imaging results that were available during my care of the patient were reviewed by me and considered in my medical decision making (see chart for details).  Review of the [place] CSRS was performed in accordance of the [facility] prior to dispensing any controlled drugs.           Patient's diagnosis is consistent with right-sided facial laceration.  Patient presented to the emergency department after sustaining a laceration to the right eyebrow/face yesterday evening.  Laceration has evidence of granulation tissue.  At this time no closure is attempted.  Patient has area thoroughly cleansed, dressed prior to discharge.  Wound care instructions discussed with patient.  Antibiotics  prophylactically.  Patient's tetanus shot was up-to-date.  Follow-up primary care as needed.. Patient is given ED precautions to return to the ED for any worsening or new symptoms.     ____________________________________________  FINAL CLINICAL IMPRESSION(S) / ED DIAGNOSES  Final diagnoses:  Facial laceration, initial encounter      NEW MEDICATIONS STARTED DURING THIS VISIT:  ED Discharge Orders         Ordered    cephALEXin (KEFLEX) 500 MG capsule  2 times daily     08/11/18 1919              This chart was dictated using voice recognition software/Dragon. Despite best efforts to proofread, errors can occur which can change the meaning. Any change was purely unintentional.    Racheal PatchesCuthriell, Brooks Kinnan D, PA-C 08/11/18 1944    Sharman CheekStafford, Phillip, MD 08/11/18 2022

## 2018-08-11 NOTE — ED Triage Notes (Signed)
Pt presents to ED via POV with c/o laceration to R side of face. States happened last night, bleeding controlled at this time.

## 2018-08-11 NOTE — ED Notes (Signed)
See triage note  States she fell and hit a table last pm  Swelling noted to right eye with superficial laceration  Bleeding controlled   C/o headache

## 2019-05-15 ENCOUNTER — Other Ambulatory Visit: Payer: Self-pay

## 2019-05-15 ENCOUNTER — Ambulatory Visit (LOCAL_COMMUNITY_HEALTH_CENTER): Payer: Medicaid Other | Admitting: Physician Assistant

## 2019-05-15 ENCOUNTER — Ambulatory Visit: Payer: Self-pay | Admitting: Physician Assistant

## 2019-05-15 ENCOUNTER — Encounter: Payer: Self-pay | Admitting: Physician Assistant

## 2019-05-15 DIAGNOSIS — Z3201 Encounter for pregnancy test, result positive: Secondary | ICD-10-CM

## 2019-05-15 DIAGNOSIS — Z3009 Encounter for other general counseling and advice on contraception: Secondary | ICD-10-CM | POA: Diagnosis not present

## 2019-05-15 LAB — PREGNANCY, URINE: Preg Test, Ur: POSITIVE — AB

## 2019-05-15 MED ORDER — PRENATAL MULTIVITAMIN CH: 1.0000 | ORAL_TABLET | Freq: Every day | ORAL | 0 refills | Status: DC

## 2019-05-15 NOTE — Progress Notes (Signed)
Patient originally scheduled as IS appt but states that she only wants pregnancy testing today.  Reports that she has taken 3 pregnancy tests at home and they were all positive.  Declines STD screening today.  Pregnancy test is positive today.  Reviewed with patient s/s of ectopic pregnancy and when to go to the ER.  Pregnancy resource sheet and proof of pregnancy sheet given to patient.  Patient is not sure whether she will continue the pregnancy and counseled which places on the resource sheet she can call for other options.  Patient questions answered.  PNV 1 po daily #1 bottle given to patient to start today.

## 2019-06-14 ENCOUNTER — Telehealth: Payer: Self-pay

## 2019-06-14 NOTE — Telephone Encounter (Signed)
LMVM advised to contact prior OB practice where pregnancy test was performed. Patient has not established care with [facility]. We are unable to advise.

## 2019-06-14 NOTE — Telephone Encounter (Signed)
Patient reports she has a little cold and is inquiring if she can take mucinex. Cb#[phone]

## 2019-06-16 ENCOUNTER — Ambulatory Visit: Payer: Medicaid Other | Attending: Internal Medicine

## 2019-06-23 ENCOUNTER — Ambulatory Visit (INDEPENDENT_AMBULATORY_CARE_PROVIDER_SITE_OTHER): Payer: Medicaid Other | Admitting: Advanced Practice Midwife

## 2019-06-23 ENCOUNTER — Other Ambulatory Visit: Payer: Self-pay

## 2019-06-23 ENCOUNTER — Other Ambulatory Visit (HOSPITAL_COMMUNITY)
Admission: RE | Admit: 2019-06-23 | Discharge: 2019-06-23 | Disposition: A | Discharge status: Home / Self Care | Payer: Medicaid Other | Source: Ambulatory Visit | Attending: Advanced Practice Midwife | Admitting: Advanced Practice Midwife

## 2019-06-23 ENCOUNTER — Encounter: Payer: Self-pay | Admitting: Advanced Practice Midwife

## 2019-06-23 VITALS — BP 100/60 | Wt 199.0 lb

## 2019-06-23 DIAGNOSIS — Z124 Encounter for screening for malignant neoplasm of cervix: Secondary | ICD-10-CM | POA: Diagnosis present

## 2019-06-23 DIAGNOSIS — Z113 Encounter for screening for infections with a predominantly sexual mode of transmission: Secondary | ICD-10-CM

## 2019-06-23 DIAGNOSIS — Z3403 Encounter for supervision of normal first pregnancy, third trimester: Secondary | ICD-10-CM | POA: Insufficient documentation

## 2019-06-23 DIAGNOSIS — O99213 Obesity complicating pregnancy, third trimester: Secondary | ICD-10-CM | POA: Insufficient documentation

## 2019-06-23 DIAGNOSIS — Z131 Encounter for screening for diabetes mellitus: Secondary | ICD-10-CM

## 2019-06-23 DIAGNOSIS — Z6833 Body mass index (BMI) 33.0-33.9, adult: Secondary | ICD-10-CM

## 2019-06-23 DIAGNOSIS — Z3402 Encounter for supervision of normal first pregnancy, second trimester: Secondary | ICD-10-CM

## 2019-06-23 DIAGNOSIS — O99212 Obesity complicating pregnancy, second trimester: Secondary | ICD-10-CM

## 2019-06-23 NOTE — Patient Instructions (Signed)
Exercise During Pregnancy Exercise is an important part of being healthy for people of all ages. Exercise improves the function of your heart and lungs and helps you maintain strength, flexibility, and a healthy body weight. Exercise also boosts energy levels and elevates mood. Most women should exercise regularly during pregnancy. In rare cases, women with certain medical conditions or complications may be asked to limit or avoid exercise during pregnancy. How does this affect me? Along with maintaining general strength and flexibility, exercising during pregnancy can help:  Keep strength in muscles that are used during labor and childbirth.  Decrease low back pain.  Reduce symptoms of depression.  Control weight gain during pregnancy.  Reduce the risk of needing insulin if you develop diabetes during pregnancy.  Decrease the risk of cesarean delivery.  Speed up your recovery after giving birth. How does this affect my baby? Exercise can help you have a healthy pregnancy. Exercise does not cause premature birth. It will not cause your baby to weigh less at birth. What exercises can I do? Many exercises are safe for you to do during pregnancy. Do a variety of exercises that safely increase your heart and breathing rates and help you build and maintain muscle strength. Do exercises exactly as told by your health care provider. You may do these exercises:  Walking or hiking.  Swimming.  Water aerobics.  Riding a stationary bike.  Strength training.  Modified yoga or Pilates. Tell your instructor that you are pregnant. Avoid overstretching, and avoid lying on your back for long periods of time.  Running or jogging. Only choose this type of exercise if you: ? Ran or jogged regularly before your pregnancy. ? Can run or jog and still talk in complete sentences. What exercises should I avoid? Depending on your level of fitness and whether you exercised regularly before your  pregnancy, you may be told to limit high-intensity exercise. You can tell that you are exercising at a high intensity if you are breathing much harder and faster and cannot hold a conversation while exercising. You must avoid:  Contact sports.  Activities that put you at risk for falling on or being hit in the belly, such as downhill skiing, water skiing, surfing, rock climbing, cycling, gymnastics, and horseback riding.  Scuba diving.  Skydiving.  Yoga or Pilates in a room that is heated to high temperatures.  Jogging or running, unless you ran or jogged regularly before your pregnancy. While jogging or running, you should always be able to talk in full sentences. Do not run or jog so fast that you are unable to have a conversation.  Do not exercise at more than 6,000 feet above sea level (high elevation) if you are not used to exercising at high elevation. How do I exercise in a safe way?   Avoid overheating. Do not exercise in very high temperatures.  Wear loose-fitting, breathable clothes.  Avoid dehydration. Drink enough water before, during, and after exercise to keep your urine pale yellow.  Avoid overstretching. Because of hormone changes during pregnancy, it is easy to overstretch muscles, tendons, and ligaments during pregnancy.  Start slowly and ask your health care provider to recommend the types of exercise that are safe for you.  Do not exercise to lose weight. Follow these instructions at home:  Exercise on most days or all days of the week. Try to exercise for 30 minutes a day, 5 days a week, unless your health care provider tells you not to.  If   you actively exercised before your pregnancy and you are healthy, your health care provider may tell you to continue to do moderate to high-intensity exercise.  If you are just starting to exercise or did not exercise much before your pregnancy, your health care provider may tell you to do low to moderate-intensity  exercise. Questions to ask your health care provider  Is exercise safe for me?  What are signs that I should stop exercising?  Does my health condition mean that I should not exercise during pregnancy?  When should I avoid exercising during pregnancy? Stop exercising and contact a health care provider if: You have any unusual symptoms, such as:  Mild contractions of the uterus or cramps in the abdomen.  Dizziness that does not go away when you rest. Stop exercising and get help right away if: You have any unusual symptoms, such as:  Sudden, severe pain in your low back or your belly.  Mild contractions of the uterus or cramps in the abdomen that do not improve with rest and drinking fluids.  Chest pain.  Bleeding or fluid leaking from your vagina.  Shortness of breath. These symptoms may represent a serious problem that is an emergency. Do not wait to see if the symptoms will go away. Get medical help right away. Call your local emergency services (911 in the [place] Do not drive yourself to the hospital. Summary  Most women should exercise regularly throughout pregnancy. In rare cases, women with certain medical conditions or complications may be asked to limit or avoid exercise during pregnancy.  Do not exercise to lose weight during pregnancy.  Your health care provider will tell you what level of physical activity is right for you.  Stop exercising and contact a health care provider if you have mild contractions of the uterus or cramps in the abdomen. Get help right away if these contractions or cramps do not improve with rest and drinking fluids.  Stop exercising and get help right away if you have sudden, severe pain in your low back or belly, chest pain, shortness of breath, or bleeding or leaking of fluid from your vagina. This information is not intended to replace advice given to you by your health care provider. Make sure you discuss any questions you have with your  health care provider. Document Revised: 05/19/2018 Document Reviewed: 03/02/2018 Elsevier Patient Education  2020 [employer] Eating Plan for Pregnant Women While you are pregnant, your body requires additional nutrition to help support your growing baby. You also have a higher need for some vitamins and minerals, such as folic acid, calcium, iron, and vitamin D. Eating a healthy, well-balanced diet is very important for your health and your baby's health. Your need for extra calories varies for the three 3-month segments of your pregnancy (trimesters). For most women, it is recommended to consume:  150 extra calories a day during the first trimester.  300 extra calories a day during the second trimester.  300 extra calories a day during the third trimester. What are tips for following this plan?   Do not try to lose weight or go on a diet during pregnancy.  Limit your overall intake of foods that have "empty calories." These are foods that have little nutritional value, such as sweets, desserts, candies, and sugar-sweetened beverages.  Eat a variety of foods (especially fruits and vegetables) to get a full range of vitamins and minerals.  Take a prenatal vitamin to help meet your additional vitamin and mineral needs   during pregnancy, specifically for folic acid, iron, calcium, and vitamin D.  Remember to stay active. Ask your health care provider what types of exercise and activities are safe for you.  Practice good food safety and cleanliness. Wash your hands before you eat and after you prepare raw meat. Wash all fruits and vegetables well before peeling or eating. Taking these actions can help to prevent food-borne illnesses that can be very dangerous to your baby, such as listeriosis. Ask your health care provider for more information about listeriosis. What does 150 extra calories look like? Healthy options that provide 150 extra calories each day could be any of the  following:  6-8 oz (170-230 g) of plain low-fat yogurt with  cup of berries.  1 apple with 2 teaspoons (11 g) of peanut butter.  Cut-up vegetables with  cup (60 g) of hummus.  8 oz (230 mL) or 1 cup of low-fat chocolate milk.  1 stick of string cheese with 1 medium orange.  1 peanut butter and jelly sandwich that is made with one slice of whole-wheat bread and 1 tsp (5 g) of peanut butter. For 300 extra calories, you could eat two of those healthy options each day. What is a healthy amount of weight to gain? The right amount of weight gain for you is based on your BMI before you became pregnant. If your BMI:  Was less than 18 (underweight), you should gain 28-40 lb (13-18 kg).  Was 18-24.9 (normal), you should gain 25-35 lb (11-16 kg).  Was 25-29.9 (overweight), you should gain 15-25 lb (7-11 kg).  Was 30 or greater (obese), you should gain 11-20 lb (5-9 kg). What if I am having twins or multiples? Generally, if you are carrying twins or multiples:  You may need to eat 300-600 extra calories a day.  The recommended range for total weight gain is 25-54 lb (11-25 kg), depending on your BMI before pregnancy.  Talk with your health care provider to find out about nutritional needs, weight gain, and exercise that is right for you. What foods can I eat?  Fruits All fruits. Eat a variety of colors and types of fruit. Remember to wash your fruits well before peeling or eating. Vegetables All vegetables. Eat a variety of colors and types of vegetables. Remember to wash your vegetables well before peeling or eating. Grains All grains. Choose whole grains, such as whole-wheat bread, oatmeal, or brown rice. Meats and other protein foods Lean meats, including chicken, turkey, fish, and lean cuts of beef, veal, or pork. If you eat fish or seafood, choose options that are higher in omega-3 fatty acids and lower in mercury, such as salmon, herring, mussels, trout, sardines, pollock,  shrimp, crab, and lobster. Tofu. Tempeh. Beans. Eggs. Peanut butter and other nut butters. Make sure that all meats, poultry, and eggs are cooked to food-safe temperatures or "well-done." Two or more servings of fish are recommended each week in order to get the most benefits from omega-3 fatty acids that are found in seafood. Choose fish that are lower in mercury. You can find more information online:  www.fda.gov Dairy Pasteurized milk and milk alternatives (such as almond milk). Pasteurized yogurt and pasteurized cheese. Cottage cheese. Sour cream. Beverages Water. Juices that contain 100% fruit juice or vegetable juice. Caffeine-free teas and decaffeinated coffee. Drinks that contain caffeine are okay to drink, but it is better to avoid caffeine. Keep your total caffeine intake to less than 200 mg each day (which is 12 oz   or 355 mL of coffee, tea, or soda) or the limit as told by your health care provider. Fats and oils Fats and oils are okay to include in moderation. Sweets and desserts Sweets and desserts are okay to include in moderation. Seasoning and other foods All pasteurized condiments. The items listed above may not be a complete list of foods and beverages you can eat. Contact a dietitian for more information. What foods are not recommended? Fruits Unpasteurized fruit juices. Vegetables Raw (unpasteurized) vegetable juices. Meats and other protein foods Lunch meats, bologna, hot dogs, or other deli meats. (If you must eat those meats, reheat them until they are steaming hot.) Refrigerated pat, meat spreads from a meat counter, smoked seafood that is found in the refrigerated section of a store. Raw or undercooked meats, poultry, and eggs. Raw fish, such as sushi or sashimi. Fish that have high mercury content, such as tilefish, shark, swordfish, and king mackerel. To learn more about mercury in fish, talk with your health care provider or look for online resources, such  as:  www.fda.gov Dairy Raw (unpasteurized) milk and any foods that have raw milk in them. Soft cheeses, such as feta, queso blanco, queso fresco, Brie, Camembert cheeses, blue-veined cheeses, and Panela cheese (unless it is made with pasteurized milk, which must be stated on the label). Beverages Alcohol. Sugar-sweetened beverages, such as sodas, teas, or energy drinks. Seasoning and other foods Homemade fermented foods and drinks, such as pickles, sauerkraut, or kombucha drinks. (Store-bought pasteurized versions of these are okay.) Salads that are made in a store or deli, such as ham salad, chicken salad, egg salad, tuna salad, and seafood salad. The items listed above may not be a complete list of foods and beverages you should avoid. Contact a dietitian for more information. Where to find more information To calculate the number of calories you need based on your height, weight, and activity level, you can use an online calculator such as:  www.choosemyplate.gov/MyPlatePlan To calculate how much weight you should gain during pregnancy, you can use an online pregnancy weight gain calculator such as:  www.choosemyplate.gov/pregnancy-weight-gain-calculator Summary  While you are pregnant, your body requires additional nutrition to help support your growing baby.  Eat a variety of foods, especially fruits and vegetables to get a full range of vitamins and minerals.  Practice good food safety and cleanliness. Wash your hands before you eat and after you prepare raw meat. Wash all fruits and vegetables well before peeling or eating. Taking these actions can help to prevent food-borne illnesses, such as listeriosis, that can be very dangerous to your baby.  Do not eat raw meat or fish. Do not eat fish that have high mercury content, such as tilefish, shark, swordfish, and king mackerel. Do not eat unpasteurized (raw) dairy.  Take a prenatal vitamin to help meet your additional vitamin and  mineral needs during pregnancy, specifically for folic acid, iron, calcium, and vitamin D. This information is not intended to replace advice given to you by your health care provider. Make sure you discuss any questions you have with your health care provider. Document Revised: 06/16/2018 Document Reviewed: 10/23/2016 Elsevier Patient Education  2020 [employer]. Prenatal Care Prenatal care is health care during pregnancy. It helps you and your unborn baby (fetus) stay as healthy as possible. Prenatal care may be provided by a midwife, a family practice health care provider, or a childbirth and pregnancy specialist (obstetrician). How does this affect me? During pregnancy, you will be closely monitored   for any new conditions that might develop. To lower your risk of pregnancy complications, you and your health care provider will talk about any underlying conditions you have. How does this affect my baby? Early and consistent prenatal care increases the chance that your baby will be healthy during pregnancy. Prenatal care lowers the risk that your baby will be:  Born early (prematurely).  Smaller than expected at birth (small for gestational age). What can I expect at the first prenatal care visit? Your first prenatal care visit will likely be the longest. You should schedule your first prenatal care visit as soon as you know that you are pregnant. Your first visit is a good time to talk about any questions or concerns you have about pregnancy. At your visit, you and your health care provider will talk about:  Your medical history, including: ? Any past pregnancies. ? Your family's medical history. ? The baby's father's medical history. ? Any long-term (chronic) health conditions you have and how you manage them. ? Any surgeries or procedures you have had. ? Any current over-the-counter or prescription medicines, herbs, or supplements you are taking.  Other factors that could pose a risk  to your baby, including:  Your home setting and your stress levels, including: ? Exposure to abuse or violence. ? Household financial strain. ? Mental health conditions you have.  Your daily health habits, including diet and exercise. Your health care provider will also:  Measure your weight, height, and blood pressure.  Do a physical exam, including a pelvic and breast exam.  Perform blood tests and urine tests to check for: ? Urinary tract infection. ? Sexually transmitted infections (STIs). ? Low iron levels in your blood (anemia). ? Blood type and certain proteins on red blood cells (Rh antibodies). ? Infections and immunity to viruses, such as hepatitis B and rubella. ? HIV (human immunodeficiency virus).  Do an ultrasound to confirm your baby's growth and development and to help predict your estimated due date (EDD). This ultrasound is done with a probe that is inserted into the vagina (transvaginal ultrasound).  Discuss your options for genetic screening.  Give you information about how to keep yourself and your baby healthy, including: ? Nutrition and taking vitamins. ? Physical activity. ? How to manage pregnancy symptoms such as nausea and vomiting (morning sickness). ? Infections and substances that may be harmful to your baby and how to avoid them. ? Food safety. ? Dental care. ? Working. ? Travel. ? Warning signs to watch for and when to call your health care provider. How often will I have prenatal care visits? After your first prenatal care visit, you will have regular visits throughout your pregnancy. The visit schedule is often as follows:  Up to week 28 of pregnancy: once every 4 weeks.  28-36 weeks: once every 2 weeks.  After 36 weeks: every week until delivery. Some women may have visits more or less often depending on any underlying health conditions and the health of the baby. Keep all follow-up and prenatal care visits as told by your health care  provider. This is important. What happens during routine prenatal care visits? Your health care provider will:  Measure your weight and blood pressure.  Check for fetal heart sounds.  Measure the height of your uterus in your abdomen (fundal height). This may be measured starting around week 20 of pregnancy.  Check the position of your baby inside your uterus.  Ask questions about your diet, sleeping patterns, and   whether you can feel the baby move.  Review warning signs to watch for and signs of labor.  Ask about any pregnancy symptoms you are having and how you are dealing with them. Symptoms may include: ? Headaches. ? Nausea and vomiting. ? Vaginal discharge. ? Swelling. ? Fatigue. ? Constipation. ? Any discomfort, including back or pelvic pain. Make a list of questions to ask your health care provider at your routine visits. What tests might I have during prenatal care visits? You may have blood, urine, and imaging tests throughout your pregnancy, such as:  Urine tests to check for glucose, protein, or signs of infection.  Glucose tests to check for a form of diabetes that can develop during pregnancy (gestational diabetes mellitus). This is usually done around week 24 of pregnancy.  An ultrasound to check your baby's growth and development and to check for birth defects. This is usually done around week 20 of pregnancy.  A test to check for group B strep (GBS) infection. This is usually done around week 36 of pregnancy.  Genetic testing. This may include blood or imaging tests, such as an ultrasound. Some genetic tests are done during the first trimester and some are done during the second trimester. What else can I expect during prenatal care visits? Your health care provider may recommend getting certain vaccines during pregnancy. These may include:  A yearly flu shot (annual influenza vaccine). This is especially important if you will be pregnant during flu  season.  Tdap (tetanus, diphtheria, pertussis) vaccine. Getting this vaccine during pregnancy can protect your baby from whooping cough (pertussis) after birth. This vaccine may be recommended between weeks 27 and 36 of pregnancy. Later in your pregnancy, your health care provider may give you information about:  Childbirth and breastfeeding classes.  Choosing a health care provider for your baby.  Umbilical cord banking.  Breastfeeding.  Birth control after your baby is born.  The hospital labor and delivery unit and how to tour it.  Registering at the hospital before you go into labor. Where to find more information  Office on Women's Health: womenshealth.gov  American Pregnancy Association: americanpregnancy.org  March of Dimes: marchofdimes.org Summary  Prenatal care helps you and your baby stay as healthy as possible during pregnancy.  Your first prenatal care visit will most likely be the longest.  You will have visits and tests throughout your pregnancy to monitor your health and your baby's health.  Bring a list of questions to your visits to ask your health care provider.  Make sure to keep all follow-up and prenatal care visits with your health care provider. This information is not intended to replace advice given to you by your health care provider. Make sure you discuss any questions you have with your health care provider. Document Revised: 05/18/2018 Document Reviewed: 01/25/2017 Elsevier Patient Education  2020 [employer]  

## 2019-06-23 NOTE — Progress Notes (Signed)
New Obstetric Patient H&P    Chief Complaint: "Desires prenatal care"   History of Present Illness: Patient is a 24 y.o. G1P0 Not Hispanic or Latino female, presents with amenorrhea and positive home pregnancy test. No LMP recorded (lmp unknown). Patient is pregnant. and based on an ultrasound done at [facility] on 06/01/19, her EDD is Estimated Date of Delivery: 12/17/19 and her EGA is [redacted]w[redacted]d. Patient was uncertain if she would keep the pregnancy early on and [facility] and [facility] u/s. Cycles are 5. days, regular, and occur approximately every : 28 days. Her first PAP smear is today.   She had a urine pregnancy test which was positive 2 or 3 month(s)  ago. Her last menstrual period was normal and lasted for  5 day(s). Since her LMP she claims she has experienced breast tenderness, fatigue, nausea, vomiting. She denies vaginal bleeding. Her past medical history is contributory for genital herpes infection. This is her first pregnancy.  Since her LMP, she admits to the use of tobacco products  Yes 2 c/d. She is trying to quit She claims she has gained   19 pounds since the start of her pregnancy.  There are cats in the home in the home  no  She admits close contact with children on a regular basis  no  She has had chicken pox in the past no She has had Tuberculosis exposures, symptoms, or previously tested positive for TB   no Current or past history of domestic violence. no  Genetic Screening/Teratology Counseling: (Includes patient, baby's father, or anyone in either family with:)   1. Patient's age >/= 68 at [facility]  no 2. Thalassemia ([place], [place], Mediterranean, or Asian background): MCV<80  no 3. Neural tube defect (meningomyelocele, spina bifida, anencephaly)  no 4. Congenital heart defect  no  5. Down syndrome  no 6. Tay-Sachs (Jewish, [place]  no 7. Canavan's Disease  no 8. Sickle cell disease or trait (African)  no  9. Hemophilia or other blood  disorders  no  10. Muscular dystrophy  no  11. Cystic fibrosis  no  12. Huntington's Chorea  no  13. Mental retardation/autism  no 14. Other inherited genetic or chromosomal disorder  no 15. Maternal metabolic disorder (DM, PKU, etc)  no 16. Patient or FOB with a child with a birth defect not listed above no  16a. Patient or FOB with a birth defect themselves no 17. Recurrent pregnancy loss, or stillbirth  no  18. Any medications since LMP other than prenatal vitamins (include vitamins, supplements, OTC meds, drugs, alcohol)  no 19. Any other genetic/environmental exposure to discuss  no  Infection History:   1. Lives with someone with TB or TB exposed  no  2. Patient or partner has history of genital herpes  yes 3. Rash or viral illness since LMP  no 4. History of STI (GC, CT, HPV, syphilis, HIV)  no 5. History of recent travel :  no  Other pertinent information:  FOB is not involved with the pregnancy    Review of Systems:10 point review of systems negative unless otherwise noted in HPI  Past Medical History:  Patient Active Problem List   Diagnosis Date Noted  . Encounter for supervision of normal first pregnancy in second trimester 06/23/2019    Clinic [facility]renatal Labs  Dating By [redacted]w[redacted]d u/s at [facility] on 4/22 Blood type:     Genetic Screen 1 Screen:    AFP:  Quad:     NIPS: Antibody:   Anatomic [place]  Rubella:   Varicella: @VZVIGG @  GTT Early:                Third trimester:  RPR:     Rhogam  HBsAg:     Vaccines TDAP:                       Flu Shot: HIV:     Baby Food                                GBS:   GC/CT:  Contraception  Pap: 1st PAP smear 06/23/19  CBB     CS/VBAC NA   Support Person Mother 06/25/19       . Obesity affecting pregnancy in second trimester 06/23/2019    Past Surgical History:  Past Surgical History:  Procedure Laterality Date  . TONSILLECTOMY      Gynecologic History: No LMP recorded (lmp unknown). Patient is  pregnant.  Obstetric History: G1P0  Family History:  History reviewed. No pertinent family history.  Family history negative for gyn/breast cancers  Social History:  Social History   Socioeconomic History  . Marital status: Single    Spouse name: Not on file  . Number of children: Not on file  . Years of education: Not on file  . Highest education level: Not on file  Occupational History  . Not on file  Tobacco Use  . Smoking status: Current Every Day Smoker    Packs/day: 0.25    Types: Cigarettes  . Smokeless tobacco: Never Used  Substance and Sexual Activity  . Alcohol use: Yes  . Drug use: Never  . Sexual activity: Not on file  Other Topics Concern  . Not on file  Social History Narrative  . Not on file   Social Determinants of Health   Financial Resource Strain:   . Difficulty of Paying Living Expenses:   Food Insecurity:   . Worried About 06/25/2019 in the Last Year:   . Programme researcher, broadcasting/film/video in the Last Year:   Transportation Needs:   . Barista (Medical):   Freight forwarder Lack of Transportation (Non-Medical):   Physical Activity:   . Days of Exercise per Week:   . Minutes of Exercise per Session:   Stress:   . Feeling of Stress :   Social Connections:   . Frequency of Communication with Friends and Family:   . Frequency of Social Gatherings with Friends and Family:   . Attends Religious Services:   . Active Member of Clubs or Organizations:   . Attends [employer]land [employer] Meetings:   Banker Marital Status:   Intimate Partner Violence:   . Fear of Current or Ex-Partner:   . Emotionally Abused:   [employer] Physically Abused:   . Sexually Abused:     Allergies:  No Known Allergies  Medications: Prior to Admission medications   Medication Sig Start Date End Date Taking? Authorizing Provider  Prenatal Vit-Fe Fumarate-FA (PRENATAL MULTIVITAMIN) TABS tablet Take 1 tablet by mouth daily at 12 noon. 05/15/19  Yes Hampton, 07/15/19, PA  valACYclovir (VALTREX)  500 MG tablet Take 500 mg by mouth 2 (two) times daily.   Yes [provider]  cephALEXin (KEFLEX) 500 MG capsule Take 2 capsules (1,000 mg total) by mouth 2 (two) times daily. Patient not taking: Reported  on 05/15/2019 08/11/18   Cuthriell, Charline Bills, PA-C  famotidine (PEPCID) 40 MG tablet Take 1 tablet (40 mg total) by mouth every evening. 06/14/17 06/14/18  Loney Hering, MD  fluticasone (FLONASE) 50 MCG/ACT nasal spray Place 2 sprays into both nostrils daily. Patient not taking: Reported on 05/15/2019 02/14/18   Menshew, Dannielle Karvonen, PA-C  ibuprofen (ADVIL,MOTRIN) 800 MG tablet Take 800 mg by mouth every 8 (eight) hours as needed.    [provider]    Physical Exam Vitals: Blood pressure 100/60, weight 199 lb (90.3 kg).  General: NAD HEENT: normocephalic, anicteric Thyroid: no enlargement, no palpable nodules Pulmonary: No increased work of breathing, CTAB Cardiovascular: RRR, distal pulses 2+ Abdomen: NABS, soft, non-tender, non-distended.  Umbilicus without lesions.  No hepatomegaly, splenomegaly or masses palpable. No evidence of hernia, FHTs 140s  Genitourinary:  External: Normal external female genitalia.  Normal urethral meatus, normal  Bartholin's and Skene's glands.    Vagina: Normal vaginal mucosa, no evidence of prolapse.    Cervix: Grossly normal in appearance, no bleeding, no CMT  Uterus: Enlarged, mobile, normal contour.    Adnexa: ovaries non-enlarged, no adnexal masses  Rectal: deferred Extremities: no edema, erythema, or tenderness Neurologic: Grossly intact Psychiatric: mood appropriate, affect full  The following were addressed during this visit:  Breastfeeding Education - Early initiation of breastfeeding    Comments: Keeps milk supply adequate, helps contract uterus and slow bleeding, and early milk is the perfect first food and is easy to digest.   - The importance of exclusive breastfeeding    Comments: Provides antibodies, Lower risk of  breast and ovarian cancers, and type-2 diabetes,Helps your body recover, Reduced chance of SIDS.   - Risks of giving your baby anything other than breast milk if you are breastfeeding    Comments: Make the baby less content with breastfeeds, may make my baby more susceptible to illness, and may reduce my milk supply.   - The importance of early skin-to-skin contact    Comments: Keeps baby warm and secure, helps keep baby's blood sugar up and breathing steady, easier to bond and breastfeed, and helps calm baby.  - Rooming-in on a 24-hour basis    Comments: Easier to learn baby's feeding cues, easier to bond and get to know each other, and encourages milk production.   - Feeding on demand or baby-led feeding    Comments: Helps prevent breastfeeding complications, helps bring in good milk supply, prevents under or overfeeding, and helps baby feel content and satisfied   - Frequent feeding to help assure optimal milk production    Comments: Making a full supply of milk requires frequent removal of milk from breasts, infant will eat 8-12 times in 24 hours, if separated from infant use breast massage, hand expression and/ or pumping to remove milk from breasts.   - Effective positioning and attachment    Comments: Helps my baby to get enough breast milk, helps to produce an adequate milk supply, and helps prevent nipple pain and damage   - Exclusive breastfeeding for the first 6 months    Comments: Builds a healthy milk supply and keeps it up, protects baby from sickness and disease, and breastmilk has everything your baby needs for the first 6 months.  - Individualized Education    Comments: Contraindications to breastfeeding and other special medical conditions Patient states she wants to formula feed and was receptive to initial breastfeeding education.  Patient states she wants to Formula feed. She did  accept initial breastfeeding education.   Assessment: 24 y.o. G1P0 at [redacted]w[redacted]d  presenting to initiate prenatal care  Plan: 1) Avoid alcoholic beverages. 2) Patient encouraged not to smoke.  3) Discontinue the use of all non-medicinal drugs and chemicals.  4) Take prenatal vitamins daily.  5) Nutrition, food safety (fish, cheese advisories, and high nitrite foods) and exercise discussed. 6) Hospital and practice style discussed with cross coverage system.  7) Genetic Screening, such as with 1st Trimester Screening, cell free fetal DNA, AFP testing, and Ultrasound, as well as with amniocentesis and CVS as appropriate, is discussed with patient. At the conclusion of today's visit patient requested genetic testing 8) Patient is asked about travel to areas at risk for the Zika virus, and counseled to avoid travel and exposure to mosquitoes or sexual partners who may have themselves been exposed to the virus. Testing is discussed, and will be ordered as appropriate.  9) PAPtima, urine culture 10) Return to clinic in 1 week for labs including 1 hr gtt, NOB panel, sickle cell screen, MaterniT 21 11) Sign release [facility] 12) Return to clinic in 4 weeks for anatomy scan and rob   Tresea Mall, CNM [facility] 06/23/2019, 1:26 PM

## 2019-06-25 LAB — URINE CULTURE

## 2019-06-27 LAB — CYTOLOGY - PAP
Chlamydia: NEGATIVE
Comment: NEGATIVE
Comment: NEGATIVE
Comment: NORMAL
Diagnosis: NEGATIVE
Neisseria Gonorrhea: NEGATIVE
Trichomonas: POSITIVE — AB

## 2019-06-28 ENCOUNTER — Other Ambulatory Visit: Payer: Self-pay | Admitting: Advanced Practice Midwife

## 2019-06-28 DIAGNOSIS — A599 Trichomoniasis, unspecified: Secondary | ICD-10-CM

## 2019-06-28 MED ORDER — METRONIDAZOLE 0.75 % VA GEL: 1.0000 | Freq: Every day | VAGINAL | 1 refills | Status: AC

## 2019-06-28 NOTE — Progress Notes (Unsigned)
Rx metro gel sent to patient pharmacy for treatment of trich infection.

## 2019-06-30 ENCOUNTER — Other Ambulatory Visit: Payer: Medicaid Other

## 2019-06-30 ENCOUNTER — Other Ambulatory Visit: Payer: Self-pay

## 2019-07-21 ENCOUNTER — Encounter: Payer: Self-pay | Admitting: Obstetrics & Gynecology

## 2019-07-21 ENCOUNTER — Other Ambulatory Visit: Payer: Self-pay | Admitting: Advanced Practice Midwife

## 2019-07-21 ENCOUNTER — Ambulatory Visit (INDEPENDENT_AMBULATORY_CARE_PROVIDER_SITE_OTHER): Payer: Medicaid Other | Admitting: Obstetrics & Gynecology

## 2019-07-21 ENCOUNTER — Other Ambulatory Visit: Payer: Medicaid Other

## 2019-07-21 ENCOUNTER — Ambulatory Visit (INDEPENDENT_AMBULATORY_CARE_PROVIDER_SITE_OTHER): Payer: Medicaid Other

## 2019-07-21 ENCOUNTER — Other Ambulatory Visit: Payer: Self-pay

## 2019-07-21 VITALS — BP 120/80 | Wt 207.0 lb

## 2019-07-21 DIAGNOSIS — Z3402 Encounter for supervision of normal first pregnancy, second trimester: Secondary | ICD-10-CM | POA: Diagnosis not present

## 2019-07-21 DIAGNOSIS — Z3A19 19 weeks gestation of pregnancy: Secondary | ICD-10-CM | POA: Diagnosis not present

## 2019-07-21 DIAGNOSIS — Z3A18 18 weeks gestation of pregnancy: Secondary | ICD-10-CM

## 2019-07-21 LAB — OB RESULTS CONSOLE VARICELLA ZOSTER ANTIBODY, IGG: Varicella: IMMUNE

## 2019-07-21 NOTE — Progress Notes (Signed)
  Subjective  Fetal Movement? yes Contractions? no Leaking Fluid? no Vaginal Bleeding? no Mild nausea, assoc w work Objective  BP 120/80   Wt 207 lb (93.9 kg)   LMP  (LMP Unknown)   BMI 34.45 kg/m  General: NAD Pumonary: no increased work of breathing Abdomen: gravid, non-tender Extremities: no edema Psychiatric: mood appropriate, affect full  Review of ULTRASOUND. I have personally reviewed images and report of recent ultrasound done at [facility]. There is a singleton gestation with subjectively normal amniotic fluid volume. The fetal biometry correlates with established dating. Detailed evaluation of the fetal anatomy was performed.The fetal anatomical survey appears within normal limits within the resolution of ultrasound as described above.  It must be noted that a normal ultrasound is unable to rule out fetal aneuploidy.    Assessment  24 y.o. G1P0 at [redacted]w[redacted]d by  12/17/2019, by Ultrasound presenting for routine prenatal visit  Plan   Problem List Items Addressed This Visit      Other   Encounter for supervision of normal first pregnancy in second trimester    Other Visit Diagnoses    [redacted] weeks gestation of pregnancy    -  Primary    PNV [place] nv f/u    Monitor renal findings, repeat cardiac images Declines NIPT Labs and glc today    Annamarie Major, MD, [name], [facility] 07/21/2019  11:34 AM

## 2019-07-21 NOTE — Patient Instructions (Signed)

## 2019-07-24 LAB — RPR+RH+ABO+RUB AB+AB SCR+CB...
Antibody Screen: NEGATIVE
HIV Screen 4th Generation wRfx: NONREACTIVE
Hematocrit: 38.5 % (ref 34.0–46.6)
Hemoglobin: 11.7 g/dL (ref 11.1–15.9)
Hepatitis B Surface Ag: NEGATIVE
MCH: 25.7 pg — ABNORMAL LOW (ref 26.6–33.0)
MCHC: 30.4 g/dL — ABNORMAL LOW (ref 31.5–35.7)
MCV: 84 fL (ref 79–97)
Platelets: 230 10*3/uL (ref 150–450)
RBC: 4.56 x10E6/uL (ref 3.77–5.28)
RDW: 13.1 % (ref 11.7–15.4)
RPR Ser Ql: NONREACTIVE
Rh Factor: POSITIVE
Rubella Antibodies, IGG: 2.45 index (ref >0.99)
Varicella zoster IgG: 182 index (ref >165)
WBC: 13 10*3/uL — ABNORMAL HIGH (ref 3.4–10.8)

## 2019-07-24 LAB — HGB FRACTIONATION CASCADE
Hgb A2: 2.4 % (ref 1.8–3.2)
Hgb A: 97.6 % (ref 96.4–98.8)
Hgb F: 0 % (ref 0.0–2.0)
Hgb S: 0 %

## 2019-07-24 LAB — GLUCOSE, 1 HOUR GESTATIONAL: Gestational Diabetes Screen: 111 mg/dL (ref 65–139)

## 2019-07-27 ENCOUNTER — Telehealth: Payer: Self-pay

## 2019-07-27 NOTE — Telephone Encounter (Signed)
Pt calling; is 19w; face is breaking out; can a cream or something be called in?  [phone]  [facility] - can take Benzyl Peroxide.

## 2019-07-28 NOTE — Telephone Encounter (Signed)
Pt aware.  Has witch hazel.  Adv as long as it doesn't have salycilic acid in it it's okay.

## 2019-07-28 NOTE — Telephone Encounter (Signed)
Left second msg

## 2019-08-08 ENCOUNTER — Telehealth: Payer: Self-pay

## 2019-08-08 NOTE — Telephone Encounter (Signed)
Pt calling to see how far along she is.  [phone]  Adv 21w 2d.  Pt asked how many months that was.  Adv to just go by weeks.

## 2019-08-11 ENCOUNTER — Other Ambulatory Visit: Payer: Self-pay | Admitting: Obstetrics & Gynecology

## 2019-08-11 DIAGNOSIS — Z3689 Encounter for other specified antenatal screening: Secondary | ICD-10-CM

## 2019-08-18 ENCOUNTER — Ambulatory Visit (INDEPENDENT_AMBULATORY_CARE_PROVIDER_SITE_OTHER): Payer: Medicaid Other | Admitting: Certified Nurse Midwife

## 2019-08-18 ENCOUNTER — Ambulatory Visit (INDEPENDENT_AMBULATORY_CARE_PROVIDER_SITE_OTHER): Payer: Medicaid Other

## 2019-08-18 ENCOUNTER — Other Ambulatory Visit: Payer: Self-pay

## 2019-08-18 ENCOUNTER — Other Ambulatory Visit: Payer: Self-pay | Admitting: Obstetrics & Gynecology

## 2019-08-18 VITALS — BP 100/70 | Ht 65.0 in | Wt 211.6 lb

## 2019-08-18 DIAGNOSIS — Z3689 Encounter for other specified antenatal screening: Secondary | ICD-10-CM

## 2019-08-18 DIAGNOSIS — Z3A22 22 weeks gestation of pregnancy: Secondary | ICD-10-CM

## 2019-08-18 DIAGNOSIS — A599 Trichomoniasis, unspecified: Secondary | ICD-10-CM

## 2019-08-18 DIAGNOSIS — N2889 Other specified disorders of kidney and ureter: Secondary | ICD-10-CM

## 2019-08-18 DIAGNOSIS — Z3402 Encounter for supervision of normal first pregnancy, second trimester: Secondary | ICD-10-CM

## 2019-08-18 LAB — POCT URINALYSIS DIPSTICK OB
Glucose, UA: NEGATIVE
POC,PROTEIN,UA: NEGATIVE

## 2019-08-18 MED ORDER — METRONIDAZOLE 500 MG PO TABS: ORAL_TABLET | ORAL | 0 refills | Status: DC

## 2019-08-18 NOTE — Patient Instructions (Signed)
Trichomoniasis Trichomoniasis is an STI (sexually transmitted infection) that can affect both women and men. In women, the outer area of the female genitalia (vulva) and the vagina are affected. In men, mainly the penis is affected, but the prostate and other reproductive organs can also be involved.  This condition can be treated with medicine. It often has no symptoms (is asymptomatic), especially in men. If not treated, trichomoniasis can last for months or years. What are the causes? This condition is caused by a parasite called Trichomonas vaginalis. Trichomoniasis most often spreads from person to person (is contagious) through sexual contact. What increases the risk? The following factors may make you more likely to develop this condition:  Having unprotected sex.  Having sex with a partner who has trichomoniasis.  Having multiple sexual partners.  Having had previous trichomoniasis infections or other STIs. What are the signs or symptoms? In women, symptoms of trichomoniasis include:  Abnormal vaginal discharge that is clear, white, gray, or yellow-green and foamy and has an unusual "fishy" odor.  Itching and irritation of the vagina and vulva.  Burning or pain during urination or sex.  Redness and swelling of the genitals. In men, symptoms of trichomoniasis include:  Penile discharge that may be foamy or contain pus.  Pain in the penis. This may happen only when urinating.  Itching or irritation inside the penis.  Burning after urination or ejaculation. How is this diagnosed? In women, this condition may be found during a routine Pap test or physical exam. It may be found in men during a routine physical exam. Your health care provider may do tests to help diagnose this infection, such as:  Urine tests (men and women).  The following in women: ? Testing the pH of the vagina. ? A vaginal swab test that checks for the Trichomonas vaginalis parasite. ? Testing vaginal  secretions. Your health care provider may test you for other STIs, including HIV (human immunodeficiency virus). How is this treated? This condition is treated with medicine taken by mouth (orally), such as metronidazole or tinidazole, to fight the infection. Your sexual partner(s) also need to be tested and treated.  If you are a woman and you plan to become pregnant or think you may be pregnant, tell your health care provider right away. Some medicines that are used to treat the infection should not be taken during pregnancy. Your health care provider may recommend over-the-counter medicines or creams to help relieve itching or irritation. You may be tested for infection again 3 months after treatment. Follow these instructions at home:  Take and use over-the-counter and prescription medicines, including creams, only as told by your health care provider.  Take your antibiotic medicine as told by your health care provider. Do not stop taking the antibiotic even if you start to feel better.  Do not have sex until 7-10 days after you finish your medicine, or until your health care provider approves. Ask your health care provider when you may start to have sex again.  (Women) Do not douche or wear tampons while you have the infection.  Discuss your infection with your sexual partner(s). Make sure that your partner gets tested and treated, if necessary.  Keep all follow-up visits as told by your health care provider. This is important. How is this prevented?   Use condoms every time you have sex. Using condoms correctly and consistently can help protect against STIs.  Avoid having multiple sexual partners.  Talk with your sexual partner about any   symptoms that either of you may have, as well as any history of STIs.  Get tested for STIs and STDs (sexually transmitted diseases) before you have sex. Ask your partner to do the same.  Do not have sexual contact if you have symptoms of  trichomoniasis or another STI. Contact a health care provider if:  You still have symptoms after you finish your medicine.  You develop pain in your abdomen.  You have pain when you urinate.  You have bleeding after sex.  You develop a rash.  You feel nauseous or you vomit.  You plan to become pregnant or think you may be pregnant. Summary  Trichomoniasis is an STI (sexually transmitted infection) that can affect both women and men.  This condition often has no symptoms (is asymptomatic), especially in men.  Without treatment, this condition can last for months or years.  You should not have sex until 7-10 days after you finish your medicine, or until your health care provider approves. Ask your health care provider when you may start to have sex again.  Discuss your infection with your sexual partner(s). Make sure that your partner gets tested and treated, if necessary. This information is not intended to replace advice given to you by your health care provider. Make sure you discuss any questions you have with your health care provider. Document Revised: 11/09/2017 Document Reviewed: 11/09/2017 Elsevier Patient Education  2020 Elsevier Inc.  

## 2019-08-20 DIAGNOSIS — A599 Trichomoniasis, unspecified: Secondary | ICD-10-CM | POA: Insufficient documentation

## 2019-08-20 DIAGNOSIS — N2889 Other specified disorders of kidney and ureter: Secondary | ICD-10-CM | POA: Insufficient documentation

## 2019-08-20 NOTE — Progress Notes (Signed)
ROB/ Follow up anatomy scan at 22wk5d: Doing well. Feeling fetal movement.Never received message regarding treatment for Trich One hour GTT=111  BP 100/70 Weight 211#9oz, negative proteinuria Anatomy scan completed There is still a bilateral pelviectasis Lt>Rt, 5.67mm vs 3.70mm  FHT 138/ FH 22cm   A: IUP at 22wk5d S=D Fetal pelviectasis, bilaterally Trich untreated  P: Explained Trichimonas infection and sexual transmission of infection. RX for Flagyl sent to pharmacy for patient and partner. Discussed how to take meds, no alcohol x 3 days. No intercourse for 1 week. RTO 4 weeks FU ultrasound at 32 weeks.  Farrel Conners, CNM

## 2019-08-21 NOTE — Telephone Encounter (Signed)
Called and left voicemail for patient to call back to be scheduled. 

## 2019-08-21 NOTE — Telephone Encounter (Signed)
Pt calling; has question about her pap test - it's important.  [phone]

## 2019-08-25 ENCOUNTER — Encounter: Payer: Medicaid Other | Admitting: Obstetrics

## 2019-09-01 ENCOUNTER — Ambulatory Visit (INDEPENDENT_AMBULATORY_CARE_PROVIDER_SITE_OTHER): Payer: Medicaid Other | Admitting: Certified Nurse Midwife

## 2019-09-01 ENCOUNTER — Other Ambulatory Visit: Payer: Self-pay

## 2019-09-01 VITALS — BP 120/56 | Wt 213.0 lb

## 2019-09-01 DIAGNOSIS — Z131 Encounter for screening for diabetes mellitus: Secondary | ICD-10-CM

## 2019-09-01 DIAGNOSIS — Z113 Encounter for screening for infections with a predominantly sexual mode of transmission: Secondary | ICD-10-CM

## 2019-09-01 DIAGNOSIS — Z3402 Encounter for supervision of normal first pregnancy, second trimester: Secondary | ICD-10-CM

## 2019-09-01 DIAGNOSIS — A599 Trichomoniasis, unspecified: Secondary | ICD-10-CM

## 2019-09-01 DIAGNOSIS — Z3A24 24 weeks gestation of pregnancy: Secondary | ICD-10-CM

## 2019-09-01 DIAGNOSIS — Z130 Encounter for screening for diseases of the blood and blood-forming organs and certain disorders involving the immune mechanism: Secondary | ICD-10-CM

## 2019-09-01 LAB — POCT URINALYSIS DIPSTICK OB: Glucose, UA: NEGATIVE

## 2019-09-01 MED ORDER — CONCEPT OB 130-92.4-1 MG PO CAPS: 1.0000 | ORAL_CAPSULE | Freq: Every day | ORAL | 10 refills | Status: DC

## 2019-09-01 NOTE — Progress Notes (Signed)
C/o please ure smaller speculum; needs refill of PNV; saw on Mychart there is some kidney swelling - is it the pt or the baby; wants to be sure baby is okay.

## 2019-09-01 NOTE — Progress Notes (Signed)
ROB at 24wk5d: Her and her partner took their Flagyl to treat Trichimonas.  Would like TOC today. Needs refill of prenatal vitamins. Is feeling fetal movement. Reviewed findings from anatomy scan regarding  Fetal pelviectasis and advised that will get FU scan at 32 weeks  Exam: FH 24cm, FHT 141. Trace proteinuria. BP 120/56. Weight up 1.5# (213#)  A: IUP at 24wk5d S=D S/p treatment for Trich  P: Aptima for TOC RTO 4 weeks for 1 hour GTT/ 28 week labs  Farrel Conners, [place]

## 2019-09-04 ENCOUNTER — Telehealth: Payer: Self-pay | Admitting: Certified Nurse Midwife

## 2019-09-04 NOTE — Telephone Encounter (Signed)
Patient aware CLG will call when results come in

## 2019-09-04 NOTE — Telephone Encounter (Signed)
Results are not back yet.  

## 2019-09-04 NOTE — Telephone Encounter (Signed)
Patient is calling for labs results. Please advise. 

## 2019-09-06 LAB — CHLAMYDIA/GONOCOCCUS/TRICHOMONAS, NAA
Chlamydia by NAA: NEGATIVE
Gonococcus by NAA: NEGATIVE
Trich vag by NAA: NEGATIVE

## 2019-09-08 ENCOUNTER — Encounter: Payer: Medicaid Other | Admitting: Obstetrics

## 2019-09-15 ENCOUNTER — Other Ambulatory Visit: Payer: Self-pay

## 2019-09-15 ENCOUNTER — Ambulatory Visit (INDEPENDENT_AMBULATORY_CARE_PROVIDER_SITE_OTHER): Payer: Medicaid Other | Admitting: Advanced Practice Midwife

## 2019-09-15 ENCOUNTER — Encounter: Payer: Self-pay | Admitting: Advanced Practice Midwife

## 2019-09-15 VITALS — BP 122/74 | Wt 216.0 lb

## 2019-09-15 DIAGNOSIS — Z3402 Encounter for supervision of normal first pregnancy, second trimester: Secondary | ICD-10-CM

## 2019-09-15 DIAGNOSIS — Z3A26 26 weeks gestation of pregnancy: Secondary | ICD-10-CM

## 2019-09-15 NOTE — Progress Notes (Signed)
  Routine Prenatal Care Visit  Subjective  Maria Raymond is a 24 y.o. G1P0 at [redacted]w[redacted]d being seen today for ongoing prenatal care.  She is currently monitored for the following issues for this low-risk pregnancy and has Encounter for supervision of normal first pregnancy in second trimester; Obesity affecting pregnancy in second trimester; Trichomonal infection; and Renal pelviectasis on their problem list.  ----------------------------------------------------------------------------------- Patient reports difficulty sleeping due to being uncomfortable.    . Vag. Bleeding: None.  Movement: Present. Leaking Fluid denies.  ----------------------------------------------------------------------------------- The following portions of the patient's history were reviewed and updated as appropriate: allergies, current medications, past family history, past medical history, past social history, past surgical history and problem list. Problem list updated.  Objective  Blood pressure 122/74, weight 216 lb (98 kg). Pregravid weight 180 lb (81.6 kg) Total Weight Gain 36 lb (16.3 kg) Urinalysis: Urine Protein    Urine Glucose    Fetal Status: Fetal Heart Rate (bpm): 149 Fundal Height: 26 cm Movement: Present     General:  Alert, oriented and cooperative. Patient is in no acute distress.  Skin: Skin is warm and dry. No rash noted.   Cardiovascular: Normal heart rate noted  Respiratory: Normal respiratory effort, no problems with respiration noted  Abdomen: Soft, gravid, appropriate for gestational age. Pain/Pressure: Absent     Pelvic:  Cervical exam deferred        Extremities: Normal range of motion.  Edema: None  Mental Status: Normal mood and affect. Normal behavior. Normal judgment and thought content.   Assessment   24 y.o. G1P0 at [redacted]w[redacted]d by  12/17/2019, by Ultrasound presenting for routine prenatal visit  Plan   Sleep: epsom salt soak before bed, benadryl or unisom PRN, pillows  Preterm labor  symptoms and general obstetric precautions including but not limited to vaginal bleeding, contractions, leaking of fluid and fetal movement were reviewed in detail with the patient.   Return in about 2 weeks (around 09/29/2019) for 28 wk labs and rob.  Tresea Mall, CNM 09/15/2019 12:04 PM

## 2019-09-15 NOTE — Progress Notes (Signed)
No vb. No lof.  

## 2019-09-28 ENCOUNTER — Telehealth: Payer: Self-pay

## 2019-09-28 NOTE — Telephone Encounter (Signed)
Patient vomited on her way to work this a.m. Inquiring if normal in 3rd trimester. Cb#[phone]

## 2019-09-28 NOTE — Telephone Encounter (Signed)
Spoke w/patient. Advised can be normal. Each patient/pregnancy is different. Monitor symptoms and call back or Report to ED if unable to keep anything down for a full 24 hours.

## 2019-09-29 ENCOUNTER — Other Ambulatory Visit: Payer: Medicaid Other

## 2019-09-29 ENCOUNTER — Other Ambulatory Visit: Payer: Self-pay

## 2019-09-29 ENCOUNTER — Ambulatory Visit (INDEPENDENT_AMBULATORY_CARE_PROVIDER_SITE_OTHER): Payer: Medicaid Other | Admitting: Obstetrics and Gynecology

## 2019-09-29 VITALS — BP 130/70 | Wt 216.0 lb

## 2019-09-29 DIAGNOSIS — Z3A28 28 weeks gestation of pregnancy: Secondary | ICD-10-CM

## 2019-09-29 DIAGNOSIS — O99212 Obesity complicating pregnancy, second trimester: Secondary | ICD-10-CM

## 2019-09-29 DIAGNOSIS — Z3402 Encounter for supervision of normal first pregnancy, second trimester: Secondary | ICD-10-CM

## 2019-09-29 DIAGNOSIS — O358XX0 Maternal care for other (suspected) fetal abnormality and damage, not applicable or unspecified: Secondary | ICD-10-CM

## 2019-09-29 DIAGNOSIS — Z113 Encounter for screening for infections with a predominantly sexual mode of transmission: Secondary | ICD-10-CM

## 2019-09-29 DIAGNOSIS — Z130 Encounter for screening for diseases of the blood and blood-forming organs and certain disorders involving the immune mechanism: Secondary | ICD-10-CM

## 2019-09-29 DIAGNOSIS — Z131 Encounter for screening for diabetes mellitus: Secondary | ICD-10-CM

## 2019-09-29 DIAGNOSIS — O99213 Obesity complicating pregnancy, third trimester: Secondary | ICD-10-CM

## 2019-09-29 DIAGNOSIS — O35EXX0 Maternal care for other (suspected) fetal abnormality and damage, fetal genitourinary anomalies, not applicable or unspecified: Secondary | ICD-10-CM

## 2019-09-29 NOTE — Progress Notes (Signed)
ROB 28 week labs 

## 2019-09-29 NOTE — Progress Notes (Signed)
    Routine Prenatal Care Visit  Subjective  Maria Raymond is a 24 y.o. G1P0 at [redacted]w[redacted]d being seen today for ongoing prenatal care.  She is currently monitored for the following issues for this low-risk pregnancy and has Encounter for supervision of normal first pregnancy in second trimester; Obesity affecting pregnancy in second trimester; Trichomonal infection; and Renal pelviectasis on their problem list.  ----------------------------------------------------------------------------------- Patient reports no complaints.   Contractions: Not present. Vag. Bleeding: None.  Movement: Present. Denies leaking of fluid.  ----------------------------------------------------------------------------------- The following portions of the patient's history were reviewed and updated as appropriate: allergies, current medications, past family history, past medical history, past social history, past surgical history and problem list. Problem list updated.   Objective  Blood pressure 130/70, weight 216 lb (98 kg). Pregravid weight 180 lb (81.6 kg) Total Weight Gain 36 lb (16.3 kg)  Body mass index is 35.94 kg/m.  Urinalysis:      Fetal Status: Fetal Heart Rate (bpm): 140   Movement: Present     General:  Alert, oriented and cooperative. Patient is in no acute distress.  Skin: Skin is warm and dry. No rash noted.   Cardiovascular: Normal heart rate noted  Respiratory: Normal respiratory effort, no problems with respiration noted  Abdomen: Soft, gravid, appropriate for gestational age. Pain/Pressure: Absent     Pelvic:  Cervical exam deferred        Extremities: Normal range of motion.     ental Status: Normal mood and affect. Normal behavior. Normal judgment and thought content.     Assessment   24 y.o. G1P0 at [redacted]w[redacted]d by  12/17/2019, by Ultrasound presenting for routine prenatal visit  Plan   pregnancy1  Problems (from 04/09/19 to present)    Problem Noted Resolved   Encounter for supervision of  normal first pregnancy in second trimester 06/23/2019 by Tresea Mall, CNM No   Overview Addendum 08/20/2019  3:38 PM by Farrel Conners, CNM    [facility]inic [facility] Prenatal Labs  Dating By [redacted]w[redacted]d u/s at [facility] on 4/22 Blood type: A/Positive/-- (06/11 1048)   Genetic Screen Declined Antibody:Negative (06/11 1048)  Anatomic US Fetal pelviectasis, bilaterally Rubella: 2.45 (06/11 1048) Varicella: Immune  GTT Early:   111             Third trimester:  RPR: Non Reactive (06/11 1048)   Rhogam  HBsAg: Negative (06/11 1048)   Vaccines TDAP:                       Flu Shot: HIV: Non Reactive (06/11 1048)   Baby Food                                GBS:   GC/CT: neg/neg. Pos Trich  Contraception  Pap: 1st PAP smear 06/23/19: NIL  CBB     CS/VBAC NA   Support Person Mother Lowella Bandy          Previous Version       Gestational age appropriate obstetric precautions including but not limited to vaginal bleeding, contractions, leaking of fluid and fetal movement were reviewed in detail with the patient.    - 28 week labs today  Return in about 2 weeks (around 10/13/2019) for  ROB 2 weeks, ROB and ultrasound 4 weeks.  Vena Austria, MD, [name], [facility] 09/29/2019, 3:39 PM

## 2019-09-30 LAB — 28 WEEK RH+PANEL
Basophils Absolute: 0 10*3/uL (ref 0.0–0.2)
Basos: 0 %
EOS (ABSOLUTE): 0.1 10*3/uL (ref 0.0–0.4)
Eos: 1 %
Gestational Diabetes Screen: 121 mg/dL (ref 65–139)
HIV Screen 4th Generation wRfx: NONREACTIVE
Hematocrit: 36.4 % (ref 34.0–46.6)
Hemoglobin: 12 g/dL (ref 11.1–15.9)
Immature Grans (Abs): 0.1 10*3/uL (ref 0.0–0.1)
Immature Granulocytes: 1 %
Lymphocytes Absolute: 2.7 10*3/uL (ref 0.7–3.1)
Lymphs: 21 %
MCH: 27 pg (ref 26.6–33.0)
MCHC: 33 g/dL (ref 31.5–35.7)
MCV: 82 fL (ref 79–97)
Monocytes Absolute: 0.9 10*3/uL (ref 0.1–0.9)
Monocytes: 7 %
Neutrophils Absolute: 9 10*3/uL — ABNORMAL HIGH (ref 1.4–7.0)
Neutrophils: 70 %
Platelets: 244 10*3/uL (ref 150–450)
RBC: 4.44 x10E6/uL (ref 3.77–5.28)
RDW: 11.9 % (ref 11.7–15.4)
RPR Ser Ql: NONREACTIVE
WBC: 12.9 10*3/uL — ABNORMAL HIGH (ref 3.4–10.8)

## 2019-10-13 ENCOUNTER — Other Ambulatory Visit: Payer: Self-pay

## 2019-10-13 ENCOUNTER — Ambulatory Visit (INDEPENDENT_AMBULATORY_CARE_PROVIDER_SITE_OTHER): Payer: Medicaid Other | Admitting: Obstetrics

## 2019-10-13 VITALS — BP 140/60 | Wt 214.0 lb

## 2019-10-13 DIAGNOSIS — Z3A30 30 weeks gestation of pregnancy: Secondary | ICD-10-CM | POA: Diagnosis not present

## 2019-10-13 DIAGNOSIS — Z3402 Encounter for supervision of normal first pregnancy, second trimester: Secondary | ICD-10-CM

## 2019-10-13 LAB — POCT URINALYSIS DIPSTICK OB
Glucose, UA: NEGATIVE
POC,PROTEIN,UA: NEGATIVE

## 2019-10-13 NOTE — Progress Notes (Signed)
Routine Prenatal Care Visit  Subjective  Maria Raymond is a 24 y.o. G1P0 at [redacted]w[redacted]d being seen today for ongoing prenatal care.  She is currently monitored for the following issues for this high-risk pregnancy and has Encounter for supervision of normal first pregnancy in second trimester; Obesity affecting pregnancy in second trimester; Trichomonal infection; and Renal pelviectasis on their problem list.  ----------------------------------------------------------------------------------- Patient reports no complaints.  She is a smoker, but has cut back from 1ppd to 2 cigs per day.  .  .   Maria Raymond denies.  ----------------------------------------------------------------------------------- The following portions of the patient's history were reviewed and updated as appropriate: allergies, current medications, past family history, past medical history, past social history, past surgical history and problem list. Problem list updated.  Objective  Blood pressure 140/60, weight 214 lb (97.1 kg). Pregravid weight 180 lb (81.6 kg) Total Weight Gain 34 lb (15.4 kg) Urinalysis: Urine Protein Negative  Urine Glucose Negative  Fetal Status:           General:  Alert, oriented and cooperative. Patient is in no acute distress.  Skin: Skin is warm and dry. No rash noted.   Cardiovascular: Normal heart rate noted  Respiratory: Normal respiratory effort, no problems with respiration noted  Abdomen: Soft, gravid, appropriate for gestational age.       Pelvic:  Cervical exam deferred        Extremities: Normal range of motion.     Mental Status: Normal mood and affect. Normal behavior. Normal judgment and thought content.   Assessment   24 y.o. G1P0 at [redacted]w[redacted]d by  12/17/2019, by Ultrasound presenting for routine prenatal visit  Plan   pregnancy1  Problems (from 04/09/19 to present)    Problem Noted Resolved   Encounter for supervision of normal first pregnancy in second trimester 06/23/2019 by  Tresea Mall, CNM No   Overview Addendum 10/02/2019  4:48 PM by Farrel Conners, CNM    [facility]inic [facility] Prenatal Labs  Dating By [redacted]w[redacted]d u/s at [facility] on 4/22 Blood type: A/Positive/-- (06/11 1048)   Genetic Screen Declined Antibody:Negative (06/11 1048)  Anatomic US Fetal pelviectasis, bilaterally Rubella: 2.45 (06/11 1048) Varicella: Immune  GTT Early:   111             Third trimester: 121 RPR: Non Reactive (06/11 1048)   Rhogam  HBsAg: Negative (06/11 1048)   Vaccines TDAP:                       Flu Shot: HIV: Non Reactive (06/11 1048)   Baby Food                                GBS:   GC/CT: neg/neg. Pos Trich  Contraception  Pap: 1st PAP smear 06/23/19: NIL  CBB     CS/VBAC NA   Support Person Mother Maria Raymond          Previous Version       Preterm labor symptoms and general obstetric precautions including but not limited to vaginal bleeding, contractions, leaking of Raymond and fetal movement were reviewed in detail with the patient. Please refer to After Visit Summary for other counseling recommendations.  F/U sono next visit to ck fetal kidneys. Prasied for cutting back on the cigs.  Return in about 2 weeks (around 10/27/2019) for return Maria Raymond.  Maria Raymond, CNM  10/13/2019 12:38 PM

## 2019-10-13 NOTE — Progress Notes (Signed)
ROB- denies tdap today (will think about it), denies flu shot, no concerns

## 2019-10-17 ENCOUNTER — Other Ambulatory Visit: Payer: Self-pay | Admitting: Obstetrics

## 2019-10-17 ENCOUNTER — Telehealth: Payer: Self-pay

## 2019-10-17 DIAGNOSIS — Z3402 Encounter for supervision of normal first pregnancy, second trimester: Secondary | ICD-10-CM

## 2019-10-17 DIAGNOSIS — K0889 Other specified disorders of teeth and supporting structures: Secondary | ICD-10-CM

## 2019-10-17 MED ORDER — OXYCODONE-ACETAMINOPHEN 5-325 MG PO TABS: 1.0000 | ORAL_TABLET | Freq: Four times a day (QID) | ORAL | 0 refills | Status: DC | PRN

## 2019-10-17 NOTE — Telephone Encounter (Signed)
Pt calling; had major mouth surgery about 5 days ago; her dentist only gave her two pain pills b/c she is preg; states her mouth is hurting up to her hear; tylenol is not helping; Dentist told her to call us for pain medication.  [phone] Pt states she has been taking e.s. tylenol q4-5hrs. [facility] [address]

## 2019-10-17 NOTE — Telephone Encounter (Signed)
Pt aware.

## 2019-10-17 NOTE — Progress Notes (Signed)
Patient  Calls office asking for mouth pain related to dental surgery that she had. Was not given any stronger medication. Will send in a RX for Percocet or Roxicodone. Mirna Mires, CNM  10/17/2019 9:29 AM

## 2019-10-27 ENCOUNTER — Ambulatory Visit (INDEPENDENT_AMBULATORY_CARE_PROVIDER_SITE_OTHER): Payer: Medicaid Other | Admitting: Obstetrics and Gynecology

## 2019-10-27 ENCOUNTER — Ambulatory Visit (INDEPENDENT_AMBULATORY_CARE_PROVIDER_SITE_OTHER): Payer: Medicaid Other

## 2019-10-27 ENCOUNTER — Other Ambulatory Visit: Payer: Self-pay

## 2019-10-27 VITALS — BP 114/70 | Wt 222.0 lb

## 2019-10-27 DIAGNOSIS — Z3402 Encounter for supervision of normal first pregnancy, second trimester: Secondary | ICD-10-CM

## 2019-10-27 DIAGNOSIS — Z3403 Encounter for supervision of normal first pregnancy, third trimester: Secondary | ICD-10-CM

## 2019-10-27 DIAGNOSIS — Z3A30 30 weeks gestation of pregnancy: Secondary | ICD-10-CM

## 2019-10-27 DIAGNOSIS — O35EXX0 Maternal care for other (suspected) fetal abnormality and damage, fetal genitourinary anomalies, not applicable or unspecified: Secondary | ICD-10-CM

## 2019-10-27 DIAGNOSIS — N2889 Other specified disorders of kidney and ureter: Secondary | ICD-10-CM

## 2019-10-27 DIAGNOSIS — Z3A32 32 weeks gestation of pregnancy: Secondary | ICD-10-CM

## 2019-10-27 DIAGNOSIS — O99213 Obesity complicating pregnancy, third trimester: Secondary | ICD-10-CM

## 2019-10-27 DIAGNOSIS — O358XX0 Maternal care for other (suspected) fetal abnormality and damage, not applicable or unspecified: Secondary | ICD-10-CM | POA: Diagnosis not present

## 2019-10-27 NOTE — Progress Notes (Signed)
ROB Refill prenatal vitamin Ultrasound today

## 2019-10-27 NOTE — Progress Notes (Signed)
Routine Prenatal Care Visit  Subjective  Maria Raymond is a 24 y.o. G1P0 at [redacted]w[redacted]d being seen today for ongoing prenatal care.  She is currently monitored for the following issues for this low-risk pregnancy and has Encounter for supervision of normal first pregnancy in third trimester; Obesity complicating pregnancy, third trimester; Trichomonal infection; and Renal pelviectasis on their problem list.  ----------------------------------------------------------------------------------- Patient reports no complaints.   Contractions: Not present. Vag. Bleeding: None.  Movement: Present. Denies leaking of fluid.  ----------------------------------------------------------------------------------- The following portions of the patient's history were reviewed and updated as appropriate: allergies, current medications, past family history, past medical history, past social history, past surgical history and problem list. Problem list updated.   Objective  Blood pressure 114/70, weight 222 lb (100.7 kg). Pregravid weight 180 lb (81.6 kg) Total Weight Gain 42 lb (19.1 kg) Urinalysis:      Fetal Status: Fetal Heart Rate (bpm): 145 Fundal Height: 32 cm Movement: Present  Presentation: Vertex  General:  Alert, oriented and cooperative. Patient is in no acute distress.  Skin: Skin is warm and dry. No rash noted.   Cardiovascular: Normal heart rate noted  Respiratory: Normal respiratory effort, no problems with respiration noted  Abdomen: Soft, gravid, appropriate for gestational age. Pain/Pressure: Absent     Pelvic:  Cervical exam deferred        Extremities: Normal range of motion.  Edema: None  ental Status: Normal mood and affect. Normal behavior. Normal judgment and thought content.   US OB Follow Up  Result Date: 10/27/2019 Patient Name: Maria Raymond DOB: 1995/07/10 MRN: [id number] ULTRASOUND REPORT Location: [facility] Date of Service: 10/27/2019 Indications: F/U Renal Pelviectasis  Findings: Mason Jim intrauterine pregnancy is visualized with FHR at 152 BPM. Fetal presentation is Cephalic. Placenta: anterior. Grade: 2 AFI: subjectively normal. There is mild pelviectasis in both kidneys. The right renal pelvis measures 4.8 mm and the left renal pelvis measures 4.7 mm. Normal appearing stomach and bladder. There is no free peritoneal fluid in the cul de sac. Impression: 1. [redacted]w[redacted]d Viable Singleton Intrauterine pregnancy previously established criteria. 2. Mild kidney pelviectasis bilaterally but within normal limits. Recommendations: 1.Clinical correlation with the patient's History and Physical Exam. Vena Austria, MD There is a singleton gestation with subjectively normal amniotic fluid volume. The visualized fetal anatomy appears within normal limits within the resolution of ultrasound as described above.  Cut off for upper limits of normal for renal pelvis dilation in the second trimester is 82mm, 5mm in the third trimester. On today scan there is no evidence of renal pelvis dilation.  It must be noted that a normal ultrasound is unable to rule out fetal aneuploidy.  Vena Austria, MD, [facility], [facility] 10/27/2019, 11:23 AM     Assessment   24 y.o. G1P0 at [redacted]w[redacted]d by  12/17/2019, by Ultrasound presenting for routine prenatal visit  Plan   pregnancy1  Problems (from 04/09/19 to present)    Problem Noted Resolved   Encounter for supervision of normal first pregnancy in third trimester 06/23/2019 by Tresea Mall, CNM No   Overview Addendum 10/02/2019  4:48 PM by Farrel Conners, CNM    [facility]inic [facility] Prenatal Labs  Dating By [redacted]w[redacted]d u/s at [facility] on 4/22 Blood type: A/Positive/-- (06/11 1048)   Genetic Screen Declined Antibody:Negative (06/11 1048)  Anatomic US Fetal pelviectasis, bilaterally Rubella: 2.45 (06/11 1048) Varicella: Immune  GTT Early:   111  Third trimester: 121 RPR: Non Reactive (06/11 1048)   Rhogam   HBsAg: Negative (06/11 1048)   Vaccines TDAP:                       Flu Shot: HIV: Non Reactive (06/11 1048)   Baby Food                                GBS:   GC/CT: neg/neg. Pos Trich  Contraception  Pap: 1st PAP smear 06/23/19: NIL  CBB     CS/VBAC NA   Support Person Mother Lowella Bandy          Previous Version      Gestational age appropriate obstetric precautions including but not limited to vaginal bleeding, contractions, leaking of fluid and fetal movement were reviewed in detail with the patient.    - Normal follow up ultrasound for renal pelvis dilation today - discussed COVID vaccination and MAB therapy  Return in about 2 weeks (around 11/10/2019) for ROB .  Vena Austria, MD, [facility], [facility] 10/27/2019, 12:08 PM

## 2019-10-30 ENCOUNTER — Other Ambulatory Visit: Payer: Self-pay

## 2019-10-30 ENCOUNTER — Telehealth: Payer: Self-pay

## 2019-10-30 DIAGNOSIS — Z3201 Encounter for pregnancy test, result positive: Secondary | ICD-10-CM

## 2019-10-30 MED ORDER — PRENATAL MULTIVITAMIN CH: 1.0000 | ORAL_TABLET | Freq: Every day | ORAL | 0 refills | Status: DC

## 2019-10-30 NOTE — Telephone Encounter (Signed)
Pt needed refill on PNV. Refilled. LM with pt

## 2019-11-10 ENCOUNTER — Ambulatory Visit (INDEPENDENT_AMBULATORY_CARE_PROVIDER_SITE_OTHER): Payer: Medicaid Other | Admitting: Advanced Practice Midwife

## 2019-11-10 ENCOUNTER — Encounter: Payer: Medicaid Other | Admitting: Obstetrics and Gynecology

## 2019-11-10 ENCOUNTER — Other Ambulatory Visit: Payer: Self-pay

## 2019-11-10 ENCOUNTER — Encounter: Payer: Self-pay | Admitting: Advanced Practice Midwife

## 2019-11-10 VITALS — BP 120/70 | Wt 228.0 lb

## 2019-11-10 DIAGNOSIS — Z23 Encounter for immunization: Secondary | ICD-10-CM

## 2019-11-10 DIAGNOSIS — Z3A34 34 weeks gestation of pregnancy: Secondary | ICD-10-CM

## 2019-11-10 DIAGNOSIS — O2603 Excessive weight gain in pregnancy, third trimester: Secondary | ICD-10-CM | POA: Insufficient documentation

## 2019-11-10 DIAGNOSIS — Z3403 Encounter for supervision of normal first pregnancy, third trimester: Secondary | ICD-10-CM

## 2019-11-10 LAB — POCT URINALYSIS DIPSTICK OB
Glucose, UA: NEGATIVE
POC,PROTEIN,UA: NEGATIVE

## 2019-11-10 NOTE — Progress Notes (Signed)
ROB- swelling in feet, TDAP/BT consent today

## 2019-11-10 NOTE — Progress Notes (Signed)
Routine Prenatal Care Visit  Subjective  Maria Raymond is a 24 y.o. G1P0 at [redacted]w[redacted]d being seen today for ongoing prenatal care.  She is currently monitored for the following issues for this low-risk pregnancy and has Encounter for supervision of normal first pregnancy in third trimester; Obesity complicating pregnancy, third trimester; Trichomonal infection; Renal pelviectasis; and Excessive weight gain during pregnancy in third trimester on their problem list.  ----------------------------------------------------------------------------------- Patient reports swelling in her lower extremities in the past week especially associated with being on her feet at work. She requests note to be out of work starting on Monday next week. She also mentions back ache.   Contractions: Irritability. Vag. Bleeding: None.  Movement: Present. Leaking Fluid denies.  ----------------------------------------------------------------------------------- The following portions of the patient's history were reviewed and updated as appropriate: allergies, current medications, past family history, past medical history, past social history, past surgical history and problem list. Problem list updated.  Objective  Blood pressure 120/70, weight 228 lb (103.4 kg). Pregravid weight 180 lb (81.6 kg) Total Weight Gain 48 lb (21.8 kg) Urinalysis: Urine Protein Negative  Urine Glucose Negative  Fetal Status: Fetal Heart Rate (bpm): 150 Fundal Height: 36 cm Movement: Present     General:  Alert, oriented and cooperative. Patient is in no acute distress.  Skin: Skin is warm and dry. No rash noted.   Cardiovascular: Normal heart rate noted  Respiratory: Normal respiratory effort, no problems with respiration noted  Abdomen: Soft, gravid, appropriate for gestational age. Pain/Pressure: Absent     Pelvic:  Cervical exam deferred        Extremities: Normal range of motion.  Edema: Trace  Mental Status: Normal mood and affect. Normal  behavior. Normal judgment and thought content.   Assessment   25 y.o. G1P0 at [redacted]w[redacted]d by  12/17/2019, by Ultrasound presenting for routine prenatal visit  Plan   pregnancy1  Problems (from 04/09/19 to present)    Problem Noted Resolved   Excessive weight gain during pregnancy in third trimester 11/10/2019 by Tresea Mall, CNM No   Encounter for supervision of normal first pregnancy in third trimester 06/23/2019 by Tresea Mall, CNM No   Overview Addendum 10/02/2019  4:48 PM by Farrel Conners, CNM    [facility]inic [facility] Prenatal Labs  Dating By [redacted]w[redacted]d u/s at [facility] on 4/22 Blood type: A/Positive/-- (06/11 1048)   Genetic Screen Declined Antibody:Negative (06/11 1048)  Anatomic US Fetal pelviectasis, bilaterally Rubella: 2.45 (06/11 1048) Varicella: Immune  GTT Early:   111             Third trimester: 121 RPR: Non Reactive (06/11 1048)   Rhogam  HBsAg: Negative (06/11 1048)   Vaccines TDAP:                       Flu Shot: HIV: Non Reactive (06/11 1048)   Baby Food                                GBS:   GC/CT: neg/neg. Pos Trich  Contraception  Pap: 1st PAP smear 06/23/19: NIL  CBB     CS/VBAC NA   Support Person Mother Lowella Bandy          Previous Version    Swelling: increase hydration, elevate legs  Out of work note written and can be accessed through MyChart  Preterm labor symptoms and general obstetric precautions including but not limited to vaginal  bleeding, contractions, leaking of fluid and fetal movement were reviewed in detail with the patient.   Return in about 2 weeks (around 11/24/2019) for growth scan and rob.  Tresea Mall, CNM 11/10/2019 3:11 PM

## 2019-11-10 NOTE — Addendum Note (Signed)
Addended by: Donnetta Hail on: 11/10/2019 03:38 PM   Modules accepted: Orders

## 2019-11-24 ENCOUNTER — Ambulatory Visit (INDEPENDENT_AMBULATORY_CARE_PROVIDER_SITE_OTHER): Payer: Medicaid Other | Admitting: Advanced Practice Midwife

## 2019-11-24 ENCOUNTER — Ambulatory Visit (INDEPENDENT_AMBULATORY_CARE_PROVIDER_SITE_OTHER): Payer: Medicaid Other

## 2019-11-24 ENCOUNTER — Other Ambulatory Visit (HOSPITAL_COMMUNITY)
Admission: RE | Admit: 2019-11-24 | Discharge: 2019-11-24 | Disposition: A | Discharge status: Home / Self Care | Payer: Medicaid Other | Source: Ambulatory Visit | Attending: Advanced Practice Midwife | Admitting: Advanced Practice Midwife

## 2019-11-24 ENCOUNTER — Encounter: Payer: Self-pay | Admitting: Advanced Practice Midwife

## 2019-11-24 ENCOUNTER — Other Ambulatory Visit: Payer: Self-pay

## 2019-11-24 VITALS — BP 120/70 | Wt 235.0 lb

## 2019-11-24 DIAGNOSIS — Z3A36 36 weeks gestation of pregnancy: Secondary | ICD-10-CM | POA: Insufficient documentation

## 2019-11-24 DIAGNOSIS — A6004 Herpesviral vulvovaginitis: Secondary | ICD-10-CM

## 2019-11-24 DIAGNOSIS — O2603 Excessive weight gain in pregnancy, third trimester: Secondary | ICD-10-CM | POA: Diagnosis not present

## 2019-11-24 DIAGNOSIS — Z3403 Encounter for supervision of normal first pregnancy, third trimester: Secondary | ICD-10-CM | POA: Insufficient documentation

## 2019-11-24 DIAGNOSIS — A6000 Herpesviral infection of urogenital system, unspecified: Secondary | ICD-10-CM | POA: Insufficient documentation

## 2019-11-24 DIAGNOSIS — Z3685 Encounter for antenatal screening for Streptococcus B: Secondary | ICD-10-CM

## 2019-11-24 DIAGNOSIS — Z3A37 37 weeks gestation of pregnancy: Secondary | ICD-10-CM

## 2019-11-24 DIAGNOSIS — Z113 Encounter for screening for infections with a predominantly sexual mode of transmission: Secondary | ICD-10-CM

## 2019-11-24 MED ORDER — VALACYCLOVIR HCL 1 G PO TABS: 500.0000 mg | ORAL_TABLET | Freq: Two times a day (BID) | ORAL | 1 refills | Status: AC

## 2019-11-24 MED ORDER — VALACYCLOVIR HCL 1 G PO TABS: 500.0000 mg | ORAL_TABLET | Freq: Two times a day (BID) | ORAL | 1 refills | Status: DC

## 2019-11-24 NOTE — Progress Notes (Signed)
Routine Prenatal Care Visit  Subjective  Maria Raymond is a 24 y.o. G1P0 at [redacted]w[redacted]d being seen today for ongoing prenatal care.  She is currently monitored for the following issues for this low-risk pregnancy and has Encounter for supervision of normal first pregnancy in third trimester; Obesity complicating pregnancy, third trimester; Trichomonal infection; Renal pelviectasis; Excessive weight gain during pregnancy in third trimester; and Genital herpes on their problem list.  ----------------------------------------------------------------------------------- Patient reports no recent hsv outbreaks.   Contractions: Not present. Vag. Bleeding: None.  Movement: Present. Leaking Fluid denies.  ----------------------------------------------------------------------------------- The following portions of the patient's history were reviewed and updated as appropriate: allergies, current medications, past family history, past medical history, past social history, past surgical history and problem list. Problem list updated.  Objective  Blood pressure 120/70, weight 235 lb (106.6 kg). Pregravid weight 180 lb (81.6 kg) Total Weight Gain 55 lb (24.9 kg) Urinalysis: Urine Protein    Urine Glucose    Fetal Status: Fetal Heart Rate (bpm): 140 Fundal Height: 37 cm Movement: Present  Presentation: Vertex   Growth: 84.8%, AC >97.7%, 7 pounds 12 ounces, AFI 22.5 cm  General:  Alert, oriented and cooperative. Patient is in no acute distress.  Skin: Skin is warm and dry. No rash noted.   Cardiovascular: Normal heart rate noted  Respiratory: Normal respiratory effort, no problems with respiration noted  Abdomen: Soft, gravid, appropriate for gestational age. Pain/Pressure: Present     Pelvic:  GBS/aptima collected        Extremities: Normal range of motion.  Edema: Trace  Mental Status: Normal mood and affect. Normal behavior. Normal judgment and thought content.   Assessment   24 y.o. G1P0 at [redacted]w[redacted]d by   12/17/2019, by Ultrasound presenting for routine prenatal visit  Plan   pregnancy1  Problems (from 04/09/19 to present)    Problem Noted Resolved   Genital herpes  by Tresea Mall, CNM No   Excessive weight gain during pregnancy in third trimester 11/10/2019 by Tresea Mall, CNM No   Encounter for supervision of normal first pregnancy in third trimester 06/23/2019 by Tresea Mall, CNM No   Overview Addendum 10/02/2019  4:48 PM by Farrel Conners, CNM    [facility]inic [facility] Prenatal Labs  Dating By [redacted]w[redacted]d u/s at [facility] on 4/22 Blood type: A/Positive/-- (06/11 1048)   Genetic Screen Declined Antibody:Negative (06/11 1048)  Anatomic US Fetal pelviectasis, bilaterally Rubella: 2.45 (06/11 1048) Varicella: Immune  GTT Early:   111             Third trimester: 121 RPR: Non Reactive (06/11 1048)   Rhogam  HBsAg: Negative (06/11 1048)   Vaccines TDAP:                       Flu Shot: HIV: Non Reactive (06/11 1048)   Baby Food                                GBS:   GC/CT: neg/neg. Pos Trich  Contraception  Pap: 1st PAP smear 06/23/19: NIL  CBB     CS/VBAC NA   Support Person Mother Lowella Bandy          Previous Version      Rx Valtrex suppression  Preterm labor symptoms and general obstetric precautions including but not limited to vaginal bleeding, contractions, leaking of fluid and fetal movement were reviewed in detail with the patient. Decrease carb  intake, increase physical activity   Return in about 1 week (around 12/01/2019) for rob.  Tresea Mall, CNM 11/24/2019 12:07 PM

## 2019-11-24 NOTE — Addendum Note (Signed)
Addended by: Tresea Mall on: 11/24/2019 04:10 PM   Modules accepted: Orders

## 2019-11-26 LAB — STREP GP B NAA: Strep Gp B NAA: NEGATIVE

## 2019-11-27 LAB — CERVICOVAGINAL ANCILLARY ONLY
Chlamydia: NEGATIVE
Comment: NEGATIVE
Comment: NEGATIVE
Comment: NORMAL
Neisseria Gonorrhea: NEGATIVE
Trichomonas: NEGATIVE

## 2019-11-29 ENCOUNTER — Telehealth: Payer: Self-pay

## 2019-11-29 NOTE — Telephone Encounter (Signed)
Pt called office; tx from [place]; c/o seeing light pink with wiping this am; has not had IC in the last 24hrs; good fm; no pain; no ctrxs.  Adv pt to wait and see what happens; if becomes bright red to go to L&D; if ctxs 5-81min apart go to L&D; if unable to stay dry go to L&D.  Diff between true ctx and [facility] ctx explained and told how to count them.  Pt states she has had B-H ctxs.  Pt states she won't go to L&D at this point.  Pt reassured.

## 2019-12-01 ENCOUNTER — Other Ambulatory Visit: Payer: Self-pay

## 2019-12-01 ENCOUNTER — Ambulatory Visit (INDEPENDENT_AMBULATORY_CARE_PROVIDER_SITE_OTHER): Payer: Medicaid Other | Admitting: Obstetrics

## 2019-12-01 VITALS — BP 120/64 | Wt 238.0 lb

## 2019-12-01 DIAGNOSIS — Z3A37 37 weeks gestation of pregnancy: Secondary | ICD-10-CM

## 2019-12-01 DIAGNOSIS — Z3403 Encounter for supervision of normal first pregnancy, third trimester: Secondary | ICD-10-CM

## 2019-12-01 NOTE — Progress Notes (Signed)
C/o pain right lower side and around to back. rj

## 2019-12-01 NOTE — Progress Notes (Signed)
Routine Prenatal Care Visit  Subjective  Maria Raymond is a 24 y.o. G1P0 at [redacted]w[redacted]d being seen today for ongoing prenatal care.  She is currently monitored for the following issues for this high-risk pregnancy and has Encounter for supervision of normal first pregnancy in third trimester; Obesity complicating pregnancy, third trimester; Trichomonal infection; Renal pelviectasis; Excessive weight gain during pregnancy in third trimester; and Genital herpes on their problem list.  ----------------------------------------------------------------------------------- Patient reports backache, no bleeding, no cramping and and she is having some round ligament pain on her right side. Also c/o vaginal dryness.    .  .   Pincus Large Fluid denies.  ----------------------------------------------------------------------------------- The following portions of the patient's history were reviewed and updated as appropriate: allergies, current medications, past family history, past medical history, past social history, past surgical history and problem list. Problem list updated.  Objective  Blood pressure 120/64, weight 238 lb (108 kg). Pregravid weight 180 lb (81.6 kg) Total Weight Gain 58 lb (26.3 kg) Urinalysis: Urine Protein    Urine Glucose    Fetal Status:           General:  Alert, oriented and cooperative. Patient is in no acute distress.  Skin: Skin is warm and dry. No rash noted.   Cardiovascular: Normal heart rate noted  Respiratory: Normal respiratory effort, no problems with respiration noted  Abdomen: Soft, gravid, appropriate for gestational age.       Pelvic:  Cervical exam performed       closed/50%/high. Very difficult exam- due to adipose.  Extremities: Normal range of motion.     Mental Status: Normal mood and affect. Normal behavior. Normal judgment and thought content.   Assessment   24 y.o. G1P0 at [redacted]w[redacted]d by  12/17/2019, by Ultrasound presenting for routine prenatal visit  Plan    pregnancy1  Problems (from 04/09/19 to present)    Problem Noted Resolved   Genital herpes  by Tresea Mall, CNM No   Excessive weight gain during pregnancy in third trimester 11/10/2019 by Tresea Mall, CNM No   Encounter for supervision of normal first pregnancy in third trimester 06/23/2019 by Tresea Mall, CNM No   Overview Addendum 12/01/2019 10:03 AM by Mirna Mires, CNM    [facility]inic [facility] Prenatal Labs  Dating By [redacted]w[redacted]d u/s at [facility] on 4/22 Blood type: A/Positive/-- (06/11 1048)   Genetic Screen Declined Antibody:Negative (06/11 1048)  Anatomic US Fetal pelviectasis, bilaterally Rubella: 2.45 (06/11 1048) Varicella: Immune  GTT Early:   111             Third trimester: 121 RPR: Non Reactive (06/11 1048)   Rhogam  HBsAg: Negative (06/11 1048)   Vaccines TDAP:                       Flu Shot: HIV: Non Reactive (06/11 1048)   Baby Food              Breast and Bottle                  GBS:  negative GC/CT: neg/neg. Pos Trich  Contraception  Pap: 1st PAP smear 06/23/19: NIL  CBB     CS/VBAC NA   Support Person Mother Lowella Bandy          Previous Version       excessive wight gain this pregnancy. Discussed labor with her- desies epidural. Interested in an IOL at 39 weeks. Please discuss next visit. Will try breast  and bottle feeding. Benefits of nursing shared today. Term labor symptoms and general obstetric precautions including but not limited to vaginal bleeding, contractions, leaking of fluid and fetal movement were reviewed in detail with the patient. Please refer to After Visit Summary for other counseling recommendations.   Return in about 1 week (around 12/08/2019) for return OB.  Mirna Mires, CNM  12/01/2019 10:05 AM

## 2019-12-08 ENCOUNTER — Ambulatory Visit (INDEPENDENT_AMBULATORY_CARE_PROVIDER_SITE_OTHER): Payer: Medicaid Other | Admitting: Obstetrics

## 2019-12-08 ENCOUNTER — Other Ambulatory Visit: Payer: Self-pay

## 2019-12-08 VITALS — BP 130/80 | Wt 243.0 lb

## 2019-12-08 DIAGNOSIS — Z3A38 38 weeks gestation of pregnancy: Secondary | ICD-10-CM

## 2019-12-08 DIAGNOSIS — O2603 Excessive weight gain in pregnancy, third trimester: Secondary | ICD-10-CM

## 2019-12-08 DIAGNOSIS — Z3403 Encounter for supervision of normal first pregnancy, third trimester: Secondary | ICD-10-CM

## 2019-12-08 LAB — POCT URINALYSIS DIPSTICK OB
Glucose, UA: NEGATIVE
POC,PROTEIN,UA: NEGATIVE

## 2019-12-08 NOTE — Progress Notes (Signed)
Routine Prenatal Care Visit  Subjective  Maria Raymond is a 24 y.o. G1P0 at [redacted]w[redacted]d being seen today for ongoing prenatal care.  She is currently monitored for the following issues for this high-risk pregnancy and has Encounter for supervision of normal first pregnancy in third trimester; Obesity complicating pregnancy, third trimester; Trichomonal infection; Renal pelviectasis; Excessive weight gain during pregnancy in third trimester; and Genital herpes on their problem list.  ----------------------------------------------------------------------------------- Patient reports no complaints. She has been walking, and using the labor ball, having IC  To ripen her cervix for labor.Some ankle swellig now- eats a fast food, high sodium diet. No headache.   .  .   Pincus Large Fluid denies.  ----------------------------------------------------------------------------------- The following portions of the patient's history were reviewed and updated as appropriate: allergies, current medications, past family history, past medical history, past social history, past surgical history and problem list. Problem list updated.  Objective  Blood pressure 130/80, weight 243 lb (110.2 kg). Pregravid weight 180 lb (81.6 kg) Total Weight Gain 63 lb (28.6 kg) Urinalysis: Urine Protein Negative  Urine Glucose Negative  Fetal Status:           General:  Alert, oriented and cooperative. Patient is in no acute distress.  Skin: Skin is warm and dry. No rash noted.   Cardiovascular: Normal heart rate noted  Respiratory: Normal respiratory effort, no problems with respiration noted  Abdomen: Soft, gravid, appropriate for gestational age.       Pelvic:  Cervical exam performed      1cm/50%/ballotable  Cervix is posterior, softening.  Extremities: Normal range of motion.     Mental Status: Normal mood and affect. Normal behavior. Normal judgment and thought content.   Assessment   24 y.o. G1P0 at [redacted]w[redacted]d by  12/17/2019, by  Ultrasound presenting for routine prenatal visit  Plan   pregnancy1  Problems (from 04/09/19 to present)    Problem Noted Resolved   Genital herpes  by Tresea Mall, CNM No   Excessive weight gain during pregnancy in third trimester 11/10/2019 by Tresea Mall, CNM No   Encounter for supervision of normal first pregnancy in third trimester 06/23/2019 by Tresea Mall, CNM No   Overview Addendum 12/01/2019 10:03 AM by Mirna Mires, CNM    [facility]inic [facility] Prenatal Labs  Dating By [redacted]w[redacted]d u/s at [facility] on 4/22 Blood type: A/Positive/-- (06/11 1048)   Genetic Screen Declined Antibody:Negative (06/11 1048)  Anatomic US Fetal pelviectasis, bilaterally Rubella: 2.45 (06/11 1048) Varicella: Immune  GTT Early:   111             Third trimester: 121 RPR: Non Reactive (06/11 1048)   Rhogam  HBsAg: Negative (06/11 1048)   Vaccines TDAP:                       Flu Shot: HIV: Non Reactive (06/11 1048)   Baby Food              Breast and Bottle                  GBS:  negative GC/CT: neg/neg. Pos Trich  Contraception  Pap: 1st PAP smear 06/23/19: NIL  CBB     CS/VBAC NA   Support Person Mother Lowella Bandy          Previous Version       Term labor symptoms and general obstetric precautions including but not limited to vaginal bleeding, contractions, leaking of fluid and fetal  movement were reviewed in detail with the patient. Please refer to After Visit Summary for other counseling recommendations.  Continues to take her Valtrex daily. Shei prefers spontaneous labor; also aware, per sono, that she has a larger baby "on board". Discussed how to reduce her salt intake- make dietary changes.  Return in about 1 week (around 12/15/2019) for return OB.  Mirna Mires, CNM  12/08/2019 10:38 AM

## 2019-12-08 NOTE — Progress Notes (Signed)
C/O pt will have 2 people in L&C with her and does not want any of her medical record discussed in front of them.rj

## 2019-12-15 ENCOUNTER — Inpatient Hospital Stay
Admission: EM | Admit: 2019-12-15 | Discharge: 2019-12-18 | DRG: 787 | Disposition: A | Discharge status: Home / Self Care | Payer: Medicaid Other | Attending: Obstetrics & Gynecology | Admitting: Obstetrics & Gynecology

## 2019-12-15 ENCOUNTER — Encounter: Payer: Self-pay | Admitting: Obstetrics and Gynecology

## 2019-12-15 ENCOUNTER — Telehealth: Payer: Self-pay

## 2019-12-15 DIAGNOSIS — Z20822 Contact with and (suspected) exposure to covid-19: Secondary | ICD-10-CM | POA: Diagnosis present

## 2019-12-15 DIAGNOSIS — O9081 Anemia of the puerperium: Secondary | ICD-10-CM | POA: Diagnosis not present

## 2019-12-15 DIAGNOSIS — O358XX0 Maternal care for other (suspected) fetal abnormality and damage, not applicable or unspecified: Secondary | ICD-10-CM | POA: Diagnosis present

## 2019-12-15 DIAGNOSIS — A6000 Herpesviral infection of urogenital system, unspecified: Secondary | ICD-10-CM | POA: Diagnosis present

## 2019-12-15 DIAGNOSIS — O99893 Other specified diseases and conditions complicating puerperium: Secondary | ICD-10-CM | POA: Diagnosis not present

## 2019-12-15 DIAGNOSIS — O99334 Smoking (tobacco) complicating childbirth: Secondary | ICD-10-CM | POA: Diagnosis present

## 2019-12-15 DIAGNOSIS — A6004 Herpesviral vulvovaginitis: Secondary | ICD-10-CM

## 2019-12-15 DIAGNOSIS — O339 Maternal care for disproportion, unspecified: Secondary | ICD-10-CM | POA: Diagnosis present

## 2019-12-15 DIAGNOSIS — Z3403 Encounter for supervision of normal first pregnancy, third trimester: Secondary | ICD-10-CM

## 2019-12-15 DIAGNOSIS — Z3A39 39 weeks gestation of pregnancy: Secondary | ICD-10-CM

## 2019-12-15 DIAGNOSIS — O2603 Excessive weight gain in pregnancy, third trimester: Secondary | ICD-10-CM

## 2019-12-15 DIAGNOSIS — D62 Acute posthemorrhagic anemia: Secondary | ICD-10-CM | POA: Diagnosis not present

## 2019-12-15 DIAGNOSIS — O9832 Other infections with a predominantly sexual mode of transmission complicating childbirth: Principal | ICD-10-CM | POA: Diagnosis present

## 2019-12-15 DIAGNOSIS — R079 Chest pain, unspecified: Secondary | ICD-10-CM | POA: Diagnosis not present

## 2019-12-15 DIAGNOSIS — F17210 Nicotine dependence, cigarettes, uncomplicated: Secondary | ICD-10-CM | POA: Diagnosis present

## 2019-12-15 DIAGNOSIS — O99214 Obesity complicating childbirth: Secondary | ICD-10-CM | POA: Diagnosis present

## 2019-12-15 NOTE — OB Triage Note (Signed)
Pt [redacted]w[redacted]d [facility] w/ complaints of ctx and possible LOF. VSS, increased BP will recheck. Monitors applied and assessing. Reports +FM, denies vag bleeding w/ pink mucous discharge.

## 2019-12-15 NOTE — Telephone Encounter (Signed)
Pt calling; is having a lot of pressure and has questions.  [phone]  Pt wanted to know if pressure was normal; adv it was; pt is having ctrxs but not counting them other than they are coming often; labor precautions given; pt wanted to know who would deliver her; adv [facility] on call until 5pm today then CRS.  Pt states not going to hosp now but will go when she isn't comfortable at home.

## 2019-12-16 ENCOUNTER — Other Ambulatory Visit: Payer: Self-pay

## 2019-12-16 ENCOUNTER — Encounter
Admission: EM | Disposition: A | Discharge status: Home / Self Care | Payer: Self-pay | Source: Home / Self Care | Attending: Obstetrics & Gynecology

## 2019-12-16 ENCOUNTER — Inpatient Hospital Stay: Payer: Medicaid Other | Admitting: Anesthesiology

## 2019-12-16 ENCOUNTER — Encounter: Payer: Self-pay | Admitting: Obstetrics and Gynecology

## 2019-12-16 DIAGNOSIS — Z3A39 39 weeks gestation of pregnancy: Secondary | ICD-10-CM

## 2019-12-16 DIAGNOSIS — O9832 Other infections with a predominantly sexual mode of transmission complicating childbirth: Secondary | ICD-10-CM | POA: Diagnosis present

## 2019-12-16 DIAGNOSIS — F17210 Nicotine dependence, cigarettes, uncomplicated: Secondary | ICD-10-CM | POA: Diagnosis present

## 2019-12-16 DIAGNOSIS — O26893 Other specified pregnancy related conditions, third trimester: Secondary | ICD-10-CM | POA: Diagnosis present

## 2019-12-16 DIAGNOSIS — A6000 Herpesviral infection of urogenital system, unspecified: Secondary | ICD-10-CM | POA: Diagnosis present

## 2019-12-16 DIAGNOSIS — O99334 Smoking (tobacco) complicating childbirth: Secondary | ICD-10-CM | POA: Diagnosis present

## 2019-12-16 DIAGNOSIS — O9081 Anemia of the puerperium: Secondary | ICD-10-CM | POA: Diagnosis not present

## 2019-12-16 DIAGNOSIS — O339 Maternal care for disproportion, unspecified: Secondary | ICD-10-CM | POA: Diagnosis present

## 2019-12-16 DIAGNOSIS — O99214 Obesity complicating childbirth: Secondary | ICD-10-CM | POA: Diagnosis present

## 2019-12-16 DIAGNOSIS — R079 Chest pain, unspecified: Secondary | ICD-10-CM | POA: Diagnosis not present

## 2019-12-16 DIAGNOSIS — O358XX0 Maternal care for other (suspected) fetal abnormality and damage, not applicable or unspecified: Secondary | ICD-10-CM | POA: Diagnosis present

## 2019-12-16 DIAGNOSIS — O99893 Other specified diseases and conditions complicating puerperium: Secondary | ICD-10-CM | POA: Diagnosis not present

## 2019-12-16 DIAGNOSIS — Z20822 Contact with and (suspected) exposure to covid-19: Secondary | ICD-10-CM | POA: Diagnosis present

## 2019-12-16 DIAGNOSIS — D62 Acute posthemorrhagic anemia: Secondary | ICD-10-CM | POA: Diagnosis not present

## 2019-12-16 LAB — COMPREHENSIVE METABOLIC PANEL
ALT: 15 U/L (ref 0–44)
AST: 22 U/L (ref 15–41)
Albumin: 3.3 g/dL — ABNORMAL LOW (ref 3.5–5.0)
Alkaline Phosphatase: 100 U/L (ref 38–126)
Anion gap: 9 (ref 5–15)
BUN: 14 mg/dL (ref 6–20)
CO2: 19 mmol/L — ABNORMAL LOW (ref 22–32)
Calcium: 9.3 mg/dL (ref 8.9–10.3)
Chloride: 107 mmol/L (ref 98–111)
Creatinine, Ser: 0.7 mg/dL (ref 0.44–1.00)
GFR, Estimated: >60 mL/min (ref >60)
Glucose, Bld: 95 mg/dL (ref 70–99)
Potassium: 4 mmol/L (ref 3.5–5.1)
Sodium: 135 mmol/L (ref 135–145)
Total Bilirubin: 0.7 mg/dL (ref 0.3–1.2)
Total Protein: 6.4 g/dL — ABNORMAL LOW (ref 6.5–8.1)

## 2019-12-16 LAB — RAPID HIV SCREEN (HIV 1/2 AB+AG)
HIV 1/2 Antibodies: NONREACTIVE
HIV-1 P24 Antigen - HIV24: NONREACTIVE

## 2019-12-16 LAB — TYPE AND SCREEN
ABO/RH(D): A POS
Antibody Screen: NEGATIVE

## 2019-12-16 LAB — CBC
HCT: 38.3 % (ref 36.0–46.0)
Hemoglobin: 12.3 g/dL (ref 12.0–15.0)
MCH: 26.9 pg (ref 26.0–34.0)
MCHC: 32.1 g/dL (ref 30.0–36.0)
MCV: 83.6 fL (ref 80.0–100.0)
Platelets: 248 10*3/uL (ref 150–400)
RBC: 4.58 MIL/uL (ref 3.87–5.11)
RDW: 15.3 % (ref 11.5–15.5)
WBC: 14.9 10*3/uL — ABNORMAL HIGH (ref 4.0–10.5)
nRBC: 0 % (ref 0.0–0.2)

## 2019-12-16 LAB — PROTEIN / CREATININE RATIO, URINE
Creatinine, Urine: 181 mg/dL
Protein Creatinine Ratio: 0.12 mg/mg{Cre} (ref 0.00–0.15)
Total Protein, Urine: 21 mg/dL

## 2019-12-16 LAB — RPR: RPR Ser Ql: NONREACTIVE

## 2019-12-16 LAB — RESPIRATORY PANEL BY RT PCR (FLU A&B, COVID)
Influenza A by PCR: NEGATIVE
Influenza B by PCR: NEGATIVE
SARS Coronavirus 2 by RT PCR: NEGATIVE

## 2019-12-16 LAB — RUPTURE OF MEMBRANE (ROM)PLUS: Rom Plus: NEGATIVE

## 2019-12-16 LAB — ABO/RH: ABO/RH(D): A POS

## 2019-12-16 SURGERY — Surgical Case
Anesthesia: Epidural

## 2019-12-16 MED ORDER — SOD CITRATE-CITRIC ACID 500-334 MG/5ML PO SOLN
30.0000 mL | ORAL | Status: DC | PRN
  Administered 2019-12-16: 30 mL via ORAL
  Filled 2019-12-16 (×2): qty 15

## 2019-12-16 MED ORDER — ACETAMINOPHEN 10 MG/ML IV SOLN
INTRAVENOUS | Status: DC | PRN
  Administered 2019-12-16: 1000 mg via INTRAVENOUS

## 2019-12-16 MED ORDER — SODIUM CHLORIDE 0.9 % IV SOLN
INTRAVENOUS | Status: AC
  Filled 2019-12-16: qty 2

## 2019-12-16 MED ORDER — LIDOCAINE HCL (PF) 2 % IJ SOLN
INTRAMUSCULAR | Status: DC | PRN
  Administered 2019-12-16 (×4): 100 mg via EPIDURAL

## 2019-12-16 MED ORDER — PHENYLEPHRINE 40 MCG/ML (10ML) SYRINGE FOR IV PUSH (FOR BLOOD PRESSURE SUPPORT): 80.0000 ug | PREFILLED_SYRINGE | INTRAVENOUS | Status: DC | PRN

## 2019-12-16 MED ORDER — OXYTOCIN BOLUS FROM INFUSION: 333.0000 mL | Freq: Once | INTRAVENOUS | Status: DC

## 2019-12-16 MED ORDER — AMMONIA AROMATIC IN INHA
RESPIRATORY_TRACT | Status: AC
  Filled 2019-12-16: qty 10

## 2019-12-16 MED ORDER — TERBUTALINE SULFATE 1 MG/ML IJ SOLN: 0.2500 mg | Freq: Once | INTRAMUSCULAR | Status: DC | PRN

## 2019-12-16 MED ORDER — MORPHINE SULFATE (PF) 10 MG/ML IV SOLN
INTRAVENOUS | Status: DC | PRN
Start: 2019-12-16 — End: 2019-12-16
  Administered 2019-12-16 (×2): 1.5 mg via BUCCAL

## 2019-12-16 MED ORDER — SODIUM CHLORIDE 0.9 % IV SOLN
2.0000 g | INTRAVENOUS | Status: AC
  Administered 2019-12-16: 2 g via INTRAVENOUS

## 2019-12-16 MED ORDER — EPHEDRINE 5 MG/ML INJ: 10.0000 mg | INTRAVENOUS | Status: DC | PRN

## 2019-12-16 MED ORDER — BUPIVACAINE 0.25 % ON-Q PUMP DUAL CATH 400 ML
400.0000 mL | INJECTION | Status: DC
  Filled 2019-12-16: qty 400

## 2019-12-16 MED ORDER — BUPIVACAINE HCL (PF) 0.5 % IJ SOLN
INTRAMUSCULAR | Status: DC | PRN
  Administered 2019-12-16: 30 mL

## 2019-12-16 MED ORDER — FENTANYL CITRATE (PF) 100 MCG/2ML IJ SOLN
25.0000 ug | INTRAMUSCULAR | Status: DC | PRN
  Administered 2019-12-16 (×2): 50 ug via INTRAVENOUS

## 2019-12-16 MED ORDER — DEXAMETHASONE SODIUM PHOSPHATE 10 MG/ML IJ SOLN
INTRAMUSCULAR | Status: AC
  Filled 2019-12-16: qty 1

## 2019-12-16 MED ORDER — LACTATED RINGERS IV SOLN
500.0000 mL | Freq: Once | INTRAVENOUS | Status: AC
  Administered 2019-12-16: 500 mL via INTRAVENOUS

## 2019-12-16 MED ORDER — SODIUM CHLORIDE 0.9 % IV SOLN
INTRAVENOUS | Status: DC | PRN
  Administered 2019-12-16 (×2): 5 mL via EPIDURAL

## 2019-12-16 MED ORDER — BUPIVACAINE HCL (PF) 0.5 % IJ SOLN
INTRAMUSCULAR | Status: AC
  Filled 2019-12-16: qty 30

## 2019-12-16 MED ORDER — KETAMINE HCL 50 MG/ML IJ SOLN
INTRAMUSCULAR | Status: AC
  Filled 2019-12-16: qty 10

## 2019-12-16 MED ORDER — BUPIVACAINE HCL 0.5 % IJ SOLN
10.0000 mL | Freq: Once | INTRAMUSCULAR | Status: DC
  Filled 2019-12-16: qty 10

## 2019-12-16 MED ORDER — PROPOFOL 10 MG/ML IV BOLUS
INTRAVENOUS | Status: DC | PRN
  Administered 2019-12-16: 200 mg via INTRAVENOUS

## 2019-12-16 MED ORDER — HYDROMORPHONE HCL 1 MG/ML IJ SOLN
INTRAMUSCULAR | Status: AC
  Filled 2019-12-16: qty 1

## 2019-12-16 MED ORDER — ONDANSETRON HCL 4 MG/2ML IJ SOLN
INTRAMUSCULAR | Status: AC
  Filled 2019-12-16: qty 2

## 2019-12-16 MED ORDER — LIDOCAINE-EPINEPHRINE (PF) 1.5 %-1:200000 IJ SOLN
INTRAMUSCULAR | Status: DC | PRN
  Administered 2019-12-16: 3 mL via EPIDURAL

## 2019-12-16 MED ORDER — PROMETHAZINE HCL 25 MG/ML IJ SOLN: 6.2500 mg | INTRAMUSCULAR | Status: DC | PRN

## 2019-12-16 MED ORDER — POVIDONE-IODINE 10 % EX SWAB
2.0000 "application " | Freq: Once | CUTANEOUS | Status: DC
  Administered 2019-12-16: 2 via TOPICAL

## 2019-12-16 MED ORDER — KETOROLAC TROMETHAMINE 30 MG/ML IJ SOLN
INTRAMUSCULAR | Status: AC
  Filled 2019-12-16: qty 1

## 2019-12-16 MED ORDER — ACETAMINOPHEN 10 MG/ML IV SOLN
INTRAVENOUS | Status: AC
  Filled 2019-12-16: qty 100

## 2019-12-16 MED ORDER — FENTANYL CITRATE (PF) 100 MCG/2ML IJ SOLN
INTRAMUSCULAR | Status: AC
  Filled 2019-12-16: qty 2

## 2019-12-16 MED ORDER — OXYCODONE HCL 5 MG/5ML PO SOLN: 5.0000 mg | Freq: Once | ORAL | Status: DC | PRN

## 2019-12-16 MED ORDER — FENTANYL CITRATE (PF) 100 MCG/2ML IJ SOLN
INTRAMUSCULAR | Status: DC | PRN
Start: 2019-12-16 — End: 2019-12-16
  Administered 2019-12-16: 100 ug via EPIDURAL

## 2019-12-16 MED ORDER — KETOROLAC TROMETHAMINE 30 MG/ML IJ SOLN
INTRAMUSCULAR | Status: DC | PRN
  Administered 2019-12-16: 30 mg via INTRAVENOUS

## 2019-12-16 MED ORDER — BUTORPHANOL TARTRATE 1 MG/ML IJ SOLN
2.0000 mg | INTRAMUSCULAR | Status: DC | PRN
  Administered 2019-12-16 (×2): 2 mg via INTRAVENOUS
  Filled 2019-12-16 (×3): qty 2

## 2019-12-16 MED ORDER — SOD CITRATE-CITRIC ACID 500-334 MG/5ML PO SOLN: 30.0000 mL | ORAL | Status: DC

## 2019-12-16 MED ORDER — OXYTOCIN-SODIUM CHLORIDE 30-0.9 UT/500ML-% IV SOLN
1.0000 m[IU]/min | INTRAVENOUS | Status: DC
  Administered 2019-12-16: 1 m[IU]/min via INTRAVENOUS

## 2019-12-16 MED ORDER — MISOPROSTOL 200 MCG PO TABS
ORAL_TABLET | ORAL | Status: AC
  Filled 2019-12-16: qty 4

## 2019-12-16 MED ORDER — LACTATED RINGERS IV SOLN
500.0000 mL | INTRAVENOUS | Status: DC | PRN
  Administered 2019-12-16: 500 mL via INTRAVENOUS

## 2019-12-16 MED ORDER — SUCCINYLCHOLINE CHLORIDE 20 MG/ML IJ SOLN
INTRAMUSCULAR | Status: DC | PRN
  Administered 2019-12-16: 200 mg via INTRAVENOUS

## 2019-12-16 MED ORDER — BUPIVACAINE IN DEXTROSE 0.75-8.25 % IT SOLN
INTRATHECAL | Status: DC | PRN
  Administered 2019-12-16: 1 mL via INTRATHECAL

## 2019-12-16 MED ORDER — HYDROMORPHONE HCL 1 MG/ML IJ SOLN
0.2500 mg | INTRAMUSCULAR | Status: DC | PRN
  Administered 2019-12-16: 0.25 mg via INTRAVENOUS

## 2019-12-16 MED ORDER — FENTANYL 2.5 MCG/ML W/ROPIVACAINE 0.15% IN NS 100 ML EPIDURAL (ARMC)
EPIDURAL | Status: AC
  Filled 2019-12-16: qty 100

## 2019-12-16 MED ORDER — MORPHINE SULFATE (PF) 0.5 MG/ML IJ SOLN
INTRAMUSCULAR | Status: AC
  Filled 2019-12-16: qty 10

## 2019-12-16 MED ORDER — LIDOCAINE HCL (PF) 1 % IJ SOLN
INTRAMUSCULAR | Status: DC | PRN
  Administered 2019-12-16 (×2): 1 mL via SUBCUTANEOUS

## 2019-12-16 MED ORDER — OXYCODONE HCL 5 MG PO TABS: 5.0000 mg | ORAL_TABLET | Freq: Once | ORAL | Status: DC | PRN

## 2019-12-16 MED ORDER — KETAMINE HCL 10 MG/ML IJ SOLN
INTRAMUSCULAR | Status: DC | PRN
  Administered 2019-12-16: 25 mg via INTRAVENOUS

## 2019-12-16 MED ORDER — LACTATED RINGERS IV SOLN: INTRAVENOUS | Status: DC

## 2019-12-16 MED ORDER — DEXAMETHASONE SODIUM PHOSPHATE 10 MG/ML IJ SOLN
INTRAMUSCULAR | Status: DC | PRN
  Administered 2019-12-16: 10 mg via INTRAVENOUS

## 2019-12-16 MED ORDER — LIDOCAINE HCL (PF) 1 % IJ SOLN
30.0000 mL | INTRAMUSCULAR | Status: DC | PRN
  Filled 2019-12-16: qty 30

## 2019-12-16 MED ORDER — PHENYLEPHRINE HCL (PRESSORS) 10 MG/ML IV SOLN
INTRAVENOUS | Status: DC | PRN
  Administered 2019-12-16 (×3): 100 ug via INTRAVENOUS

## 2019-12-16 MED ORDER — ONDANSETRON HCL 4 MG/2ML IJ SOLN: 4.0000 mg | Freq: Four times a day (QID) | INTRAMUSCULAR | Status: DC | PRN

## 2019-12-16 MED ORDER — OXYTOCIN 10 UNIT/ML IJ SOLN
INTRAMUSCULAR | Status: AC
  Filled 2019-12-16: qty 2

## 2019-12-16 MED ORDER — OXYTOCIN-SODIUM CHLORIDE 30-0.9 UT/500ML-% IV SOLN
2.5000 [IU]/h | INTRAVENOUS | Status: DC
  Administered 2019-12-16: 2.5 [IU]/h via INTRAVENOUS
  Administered 2019-12-16: 30 [IU]/h via INTRAVENOUS
  Filled 2019-12-16 (×3): qty 500

## 2019-12-16 MED ORDER — DIPHENHYDRAMINE HCL 50 MG/ML IJ SOLN: 12.5000 mg | INTRAMUSCULAR | Status: DC | PRN

## 2019-12-16 MED ORDER — ACETAMINOPHEN 325 MG PO TABS: 650.0000 mg | ORAL_TABLET | ORAL | Status: DC | PRN

## 2019-12-16 MED ORDER — FENTANYL 2.5 MCG/ML W/ROPIVACAINE 0.15% IN NS 100 ML EPIDURAL (ARMC)
12.0000 mL/h | EPIDURAL | Status: DC
  Administered 2019-12-16 (×2): 12 mL/h via EPIDURAL
  Filled 2019-12-16: qty 100

## 2019-12-16 MED ORDER — ONDANSETRON HCL 4 MG/2ML IJ SOLN
INTRAMUSCULAR | Status: DC | PRN
  Administered 2019-12-16: 4 mg via INTRAVENOUS

## 2019-12-16 SURGICAL SUPPLY — 26 items
CANISTER SUCT 3000ML PPV (MISCELLANEOUS) ×2 IMPLANT
CATH KIT ON-Q SILVERSOAK 5IN (CATHETERS) ×4 IMPLANT
CHLORAPREP W/TINT 26 (MISCELLANEOUS) ×4 IMPLANT
COVER WAND RF STERILE (DRAPES) ×2 IMPLANT
DERMABOND ADVANCED (GAUZE/BANDAGES/DRESSINGS) ×2
DERMABOND ADVANCED .7 DNX12 (GAUZE/BANDAGES/DRESSINGS) ×2 IMPLANT
DRSG OPSITE POSTOP 4X10 (GAUZE/BANDAGES/DRESSINGS) ×2 IMPLANT
ELECT CAUTERY BLADE 6.4 (BLADE) ×2 IMPLANT
ELECT REM PT RETURN 9FT ADLT (ELECTROSURGICAL) ×2
ELECTRODE REM PT RTRN 9FT ADLT (ELECTROSURGICAL) ×1 IMPLANT
GLOVE SKINSENSE NS SZ8.0 LF (GLOVE) ×1
GLOVE SKINSENSE STRL SZ8.0 LF (GLOVE) ×1 IMPLANT
GOWN STRL REUS W/ TWL LRG LVL3 (GOWN DISPOSABLE) ×1 IMPLANT
GOWN STRL REUS W/ TWL XL LVL3 (GOWN DISPOSABLE) ×2 IMPLANT
GOWN STRL REUS W/TWL LRG LVL3 (GOWN DISPOSABLE) ×1
GOWN STRL REUS W/TWL XL LVL3 (GOWN DISPOSABLE) ×2
MANIFOLD NEPTUNE II (INSTRUMENTS) ×2 IMPLANT
NS IRRIG 1000ML POUR BTL (IV SOLUTION) ×2 IMPLANT
PACK C SECTION AR (MISCELLANEOUS) ×2 IMPLANT
PAD OB MATERNITY 4.3X12.25 (PERSONAL CARE ITEMS) ×2 IMPLANT
PAD PREP 24X41 OB/GYN DISP (PERSONAL CARE ITEMS) ×2 IMPLANT
PENCIL SMOKE ULTRAEVAC 22 CON (MISCELLANEOUS) ×2 IMPLANT
SUT MAXON ABS #0 GS21 30IN (SUTURE) ×4 IMPLANT
SUT VIC AB 1 CT1 36 (SUTURE) ×6 IMPLANT
SUT VIC AB 2-0 CT1 36 (SUTURE) ×4 IMPLANT
SUT VIC AB 4-0 FS2 27 (SUTURE) ×2 IMPLANT

## 2019-12-16 NOTE — Progress Notes (Signed)
  Labor Progress Note   24 y.o. G1P0 @ [redacted]w[redacted]d , admitted for  Pregnancy, Labor Management.   Subjective:  Min pain  Objective:  BP 131/83   Pulse 99   Temp 98.4 F (36.9 C) (Oral)   Resp 16   Ht 5\' 5"  (1.651 m)   Wt 110.2 kg   LMP  (LMP Unknown)   SpO2 98%   BMI 40.44 kg/m  Abd: gravid, ND, FHT present, mild tenderness on exam Extr: trace to 1+ bilateral pedal edema SVE: CERVIX: 9 cm dilated, 89 effaced, -2 station  EFM: FHR: 120 bpm, variability: moderate,  accelerations:  Present,  decelerations:  Absent Toco: Frequency: Every 2 minutes  Assessment & Plan:  G1P0 @ [redacted]w[redacted]d, admitted for  Pregnancy and Labor/Delivery Management  1. Pain management: epidural. 2. FWB: FHT category 1.  3. ID: GBS negative 4. Labor management: Pitocin at 10 mU/min IUPC placed to assess mVU and titration of Pitocin  All discussed with patient, see orders  [redacted]w[redacted]d, MD, Annamarie Major Ob/Gyn, [facility] 12/16/2019  4:24 PM

## 2019-12-16 NOTE — Anesthesia Procedure Notes (Addendum)
Spinal  Patient location during procedure: OR Start time: 12/16/2019 9:38 PM End time: 12/16/2019 9:39 PM Staffing Performed: anesthesiologist  Anesthesiologist: Piscitello, Joseph K, MD Preanesthetic Checklist Completed: patient identified, IV checked, site marked, risks and benefits discussed, surgical consent, monitors and equipment checked, pre-op evaluation and timeout performed Spinal Block Patient position: sitting Prep: ChloraPrep Patient monitoring: heart rate, continuous pulse ox, blood pressure and cardiac monitor Approach: midline Location: L3-4 Injection technique: single-shot Needle Needle type: Whitacre and Introducer  Needle gauge: 24 G Needle length: 9 cm Assessment Sensory level: T6 Additional Notes Negative paresthesia. Negative blood return. Positive free-flowing CSF. Expiration date of kit checked and confirmed. Patient tolerated procedure well, without complications.       

## 2019-12-16 NOTE — Progress Notes (Signed)
  Labor Progress Note   24 y.o. G1P0 @ [redacted]w[redacted]d , admitted for  Pregnancy, Labor Management.   Subjective:  Mild pain w ctxs  Objective:  BP 139/81 (BP Location: Right Arm)   Pulse 82   Temp 97.8 F (36.6 C) (Oral)   Resp 16   Ht 5\' 5"  (1.651 m)   Wt 110.2 kg   LMP  (LMP Unknown)   BMI 40.44 kg/m  Abd: gravid, ND, FHT present, mild tenderness on exam Extr: trace to 1+ bilateral pedal edema SVE: last check was 5 cm  EFM: FHR: 140 bpm, variability: moderate,  accelerations:  Present,  decelerations:  Absent Toco: Frequency: Every 3-6 minutes Labs: I have reviewed the patient's lab results.   Assessment & Plan:  G1P0 @ [redacted]w[redacted]d, admitted for  Pregnancy and Labor/Delivery Management  1. Pain management: none. 2. FWB: FHT category 1.  3. ID: GBS negative 4. Labor management: Plan AROM and Pitocin if protracted labor occurs Pt plans on Epidural as pain worsens Discussed labor management options and expectations  All discussed with patient, see orders  [redacted]w[redacted]d, MD, Annamarie Major Ob/Gyn, [facility] 12/16/2019  8:02 AM

## 2019-12-16 NOTE — Anesthesia Procedure Notes (Signed)
Procedure Name: Intubation Date/Time: 12/16/2019 9:59 PM Performed by: Estanislado Emms, CRNA Pre-anesthesia Checklist: Patient identified, Patient being monitored, Timeout performed, Emergency Drugs available and Suction available Patient Re-evaluated:Patient Re-evaluated prior to induction Oxygen Delivery Method: Circle system utilized Preoxygenation: Pre-oxygenation with 100% oxygen Induction Type: IV induction and Rapid sequence Laryngoscope Size: Miller and 2 Grade View: Grade I Tube type: Oral Tube size: 6.5 mm Number of attempts: 1 Airway Equipment and Method: Stylet Placement Confirmation: ETT inserted through vocal cords under direct vision,  positive ETCO2 and breath sounds checked- equal and bilateral Secured at: 21 cm Tube secured with: Tape Dental Injury: Teeth and Oropharynx as per pre-operative assessment

## 2019-12-16 NOTE — Discharge Summary (Signed)
Postpartum Discharge Summary  Date of Service updated: 12/18/2019     Patient Name: Maria Raymond DOB: 1995/02/17 MRN: [id number]  Date of admission: 12/15/2019 Delivery date: 12/16/2019 Delivering provider: Gae Dry  Date of discharge: 12/18/2019  Admitting diagnosis: [redacted] weeks gestation of pregnancy [Z3A.39] Intrauterine pregnancy: [redacted]w[redacted]d    Secondary diagnosis:  Principal Problem:   Arrest of descent, delivered, current hospitalization Active Problems:   [redacted] weeks gestation of pregnancy   Postpartum care following cesarean delivery   Single liveborn infant, delivered by cesarean   Encounter for care or examination of lactating mother  Additional problems: none    Discharge diagnosis: Term Pregnancy Delivered                                              Post partum procedures: none Augmentation: AROM, Pitocin and Cytotec Complications: None  Hospital course: Induction of Labor With Cesarean Section   24y.o. yo G1P0 at 340w6das admitted to the hospital 12/15/2019 for induction of labor. Patient had a labor course significant for arrest of descent afterpushing 2 hours. The patient went for cesarean section due to Arrest of Descent. Delivery details are as follows: Membrane Rupture Time/Date: 9:49 AM ,12/16/2019   Delivery Method:C-Section, Low Transverse  Details of operation can be found in separate operative Note.  Patient had an uncomplicated postpartum course. She is ambulating, tolerating a regular diet, passing flatus, and urinating well.  Patient is discharged home in stable condition on 12/18/19.      Newborn Data: Birth date:12/16/2019  Birth time:10:02 PM  Gender:Female  Living status:Living  Apgars:1 ,8  Weight:3520 g                                 Magnesium Sulfate received: No BMZ received: No Rhophylac:N/A MMR:No T-DaP:Given prenatally Flu: No Transfusion:No  Physical exam  Vitals:   12/17/19 1913 12/18/19 0028 12/18/19 0637 12/18/19 0804   BP: 134/72 129/84 138/85 136/74  Pulse: 61 71 (!) 108 (!) 105  Resp: 18 18 20 20   Temp: 98.5 F (36.9 C) 98.4 F (36.9 C) 98.7 F (37.1 C) 98.7 F (37.1 C)  TempSrc: Oral Oral  Oral  SpO2: 100% 100% 100% 99%  Weight:      Height:       General: alert, cooperative and no distress Lochia: appropriate Uterine Fundus: firm Incision: Healing well with no significant drainage, dressing is c/d/i, On Q pump intact DVT Evaluation: No evidence of DVT seen on physical exam. Labs: Lab Results  Component Value Date   WBC 22.8 (H) 12/17/2019   HGB 9.5 (L) 12/17/2019   HCT 29.7 (L) 12/17/2019   MCV 84.4 12/17/2019   PLT 215 12/17/2019   CMP Latest Ref Rng & Units 12/16/2019  Glucose 70 - 99 mg/dL 95  BUN 6 - 20 mg/dL 14  Creatinine 0.44 - 1.00 mg/dL 0.70  Sodium 135 - 145 mmol/L 135  Potassium 3.5 - 5.1 mmol/L 4.0  Chloride 98 - 111 mmol/L 107  CO2 22 - 32 mmol/L 19(L)  Calcium 8.9 - 10.3 mg/dL 9.3  Total Protein 6.5 - 8.1 g/dL 6.4(L)  Total Bilirubin 0.3 - 1.2 mg/dL 0.7  Alkaline Phos 38 - 126 U/L 100  AST 15 - 41 U/L 22  ALT  0 - 44 U/L 15   Edinburgh Score: Edinburgh Postnatal Depression Scale Screening Tool 12/17/2019  I have been able to laugh and see the funny side of things. 0  I have looked forward with enjoyment to things. 0  I have blamed myself unnecessarily when things went wrong. 1  I have been anxious or worried for no good reason. 0  I have felt scared or panicky for no good reason. 0  Things have been getting on top of me. 0  I have been so unhappy that I have had difficulty sleeping. 0  I have felt sad or miserable. 0  I have been so unhappy that I have been crying. 0  The thought of harming myself has occurred to me. 0  Edinburgh Postnatal Depression Scale Total 1      After visit meds:  Allergies as of 12/18/2019   No Known Allergies     Medication List    STOP taking these medications   promethazine 25 MG tablet Commonly known as: PHENERGAN      TAKE these medications   Folivane-OB 130-92.4-1 MG Caps Take 1 capsule by mouth daily.   oxyCODONE 5 MG immediate release tablet Commonly known as: Oxy IR/ROXICODONE Take 1 tablet (5 mg total) by mouth every 6 (six) hours as needed for up to 5 days for moderate pain or severe pain.   valACYclovir 1000 MG tablet Commonly known as: VALTREX Take 0.5 tablets (500 mg total) by mouth 2 (two) times daily.            Discharge Care Instructions  (From admission, onward)         Start     Ordered   12/18/19 0000  Discharge wound care:       Comments: Keep incision dry, clean.   12/18/19 1357           Discharge home in stable condition Infant Feeding: Breast and Formula Infant Disposition:home with mother Discharge instruction: per After Visit Summary and Postpartum booklet. Activity: Advance as tolerated. Pelvic rest for 6 weeks.  Diet: routine diet Anticipated Birth Control: PP Depo given Postpartum Appointment:1 week Additional Postpartum F/U: Incision check 1 week Future Appointments: Future Appointments  Date Time Provider [place]  12/19/2019  8:10 AM Will Bonnet, MD [facility] None   Follow up Visit:  Follow-up Information    Gae Dry, MD. Go in 1 week(s).   Specialty: Obstetrics and Gynecology Why: for post op wound check Contact information: [address] [place] [address] [phone]                 12/18/2019 Rod Can, CNM

## 2019-12-16 NOTE — Anesthesia Preprocedure Evaluation (Addendum)
Anesthesia Evaluation  Patient identified by MRN, date of birth, ID band Patient awake    Reviewed: Allergy & Precautions, H&P , NPO status , Patient's Chart, lab work & pertinent test results  History of Anesthesia Complications Negative for: history of anesthetic complications  Airway Mallampati: III  TM Distance: >3 FB Neck ROM: full    Dental  (+) Chipped   Pulmonary neg pulmonary ROS, Current Smoker,    Pulmonary exam normal        Cardiovascular Exercise Tolerance: Good (-) hypertensionNormal cardiovascular exam     Neuro/Psych    GI/Hepatic negative GI ROS,   Endo/Other  Morbid obesity  Renal/GU Renal disease  negative genitourinary   Musculoskeletal   Abdominal   Peds  Hematology negative hematology ROS (+)   Anesthesia Other Findings Past Medical History: No date: Depression No date: Genital herpes  Past Surgical History: No date: TONSILLECTOMY  BMI    Body Mass Index: 40.44 kg/m      Reproductive/Obstetrics (+) Pregnancy                            Anesthesia Physical Anesthesia Plan  ASA: III  Anesthesia Plan: Epidural   Post-op Pain Management:    Induction:   PONV Risk Score and Plan:   Airway Management Planned: Natural Airway  Additional Equipment:   Intra-op Plan:   Post-operative Plan:   Informed Consent: I have reviewed the patients History and Physical, chart, labs and discussed the procedure including the risks, benefits and alternatives for the proposed anesthesia with the patient or authorized representative who has indicated his/her understanding and acceptance.     Dental Advisory Given  Plan Discussed with: Anesthesiologist  Anesthesia Plan Comments: (Patient reports no bleeding problems and no anticoagulant use.  Plan to use epidural for C section with backup spinal/GA  Patient consented for risks of anesthesia including but not  limited to:  - adverse reactions to medications - damage to eyes, teeth, lips or other oral mucosa - nerve damage due to positioning  - risk of bleeding, infection and or nerve damage from spinal that could lead to paralysis - risk of headache or failed spinal - damage to teeth, lips or other oral mucosa - sore throat or hoarseness - damage to heart, brain, nerves, lungs, other parts of body or loss of life  Patient voiced understanding.)       Anesthesia Quick Evaluation

## 2019-12-16 NOTE — Discharge Instructions (Addendum)
Cesarean Delivery, Care After This sheet gives you information about how to care for yourself after your procedure. Your health care provider may also give you more specific instructions. If you have problems or questions, contact your health care provider. What can I expect after the procedure? After the procedure, it is common to have:  A small amount of blood or clear fluid coming from the incision.  Some redness, swelling, and pain in your incision area.  Some abdominal pain and soreness.  Vaginal bleeding (lochia). Even though you did not have a vaginal delivery, you will still have vaginal bleeding and discharge.  Pelvic cramps.  Fatigue. You may have pain, swelling, and discomfort in the tissue between your vagina and your anus (perineum) if:  Your C-section was unplanned, and you were allowed to labor and push.  An incision was made in the area (episiotomy) or the tissue tore during attempted vaginal delivery. Follow these instructions at home: Incision care   Follow instructions from your health care provider about how to take care of your incision. Make sure you: ? Wash your hands with soap and water before you change your bandage (dressing). If soap and water are not available, use hand sanitizer. ? If you have a dressing, change it or remove it as told by your health care provider. ? Leave stitches (sutures), skin staples, skin glue, or adhesive strips in place. These skin closures may need to stay in place for 2 weeks or longer. If adhesive strip edges start to loosen and curl up, you may trim the loose edges. Do not remove adhesive strips completely unless your health care provider tells you to do that.  Check your incision area every day for signs of infection. Check for: ? More redness, swelling, or pain. ? More fluid or blood. ? Warmth. ? Pus or a bad smell.  Do not take baths, swim, or use a hot tub until your health care provider says it's okay. Ask your health  care provider if you can take showers.  When you cough or sneeze, hug a pillow. This helps with pain and decreases the chance of your incision opening up (dehiscing). Do this until your incision heals. Medicines  Take over-the-counter and prescription medicines only as told by your health care provider.  If you were prescribed an antibiotic medicine, take it as told by your health care provider. Do not stop taking the antibiotic even if you start to feel better.  Do not drive or use heavy machinery while taking prescription pain medicine. Lifestyle  Do not drink alcohol. This is especially important if you are breastfeeding or taking pain medicine.  Do not use any products that contain nicotine or tobacco, such as cigarettes, e-cigarettes, and chewing tobacco. If you need help quitting, ask your health care provider. Eating and drinking  Drink at least 8 eight-ounce glasses of water every day unless told not to by your health care provider. If you breastfeed, you may need to drink even more water.  Eat high-fiber foods every day. These foods may help prevent or relieve constipation. High-fiber foods include: ? Whole grain cereals and breads. ? Brown rice. ? Beans. ? Fresh fruits and vegetables. Activity   If possible, have someone help you care for your baby and help with household activities for at least a few days after you leave the hospital.  Return to your normal activities as told by your health care provider. Ask your health care provider what activities are safe for   you.  Rest as much as possible. Try to rest or take a nap while your baby is sleeping.  Do not lift anything that is heavier than 10 lbs (4.5 kg), or the limit that you were told, until your health care provider says that it is safe.  Talk with your health care provider about when you can engage in sexual activity. This may depend on your: ? Risk of infection. ? How fast you heal. ? Comfort and desire to  engage in sexual activity. General instructions  Do not use tampons or douches until your health care provider approves.  Wear loose, comfortable clothing and a supportive and well-fitting bra.  Keep your perineum clean and dry. Wipe from front to back when you use the toilet.  If you pass a blood clot, save it and call your health care provider to discuss. Do not flush blood clots down the toilet before you get instructions from your health care provider.  Keep all follow-up visits for you and your baby as told by your health care provider. This is important. Contact a health care provider if:  You have: ? A fever. ? Bad-smelling vaginal discharge. ? Pus or a bad smell coming from your incision. ? Difficulty or pain when urinating. ? A sudden increase or decrease in the frequency of your bowel movements. ? More redness, swelling, or pain around your incision. ? More fluid or blood coming from your incision. ? A rash. ? Nausea. ? Little or no interest in activities you used to enjoy. ? Questions about caring for yourself or your baby.  Your incision feels warm to the touch.  Your breasts turn red or become painful or hard.  You feel unusually sad or worried.  You vomit.  You pass a blood clot from your vagina.  You urinate more than usual.  You are dizzy or light-headed. Get help right away if:  You have: ? Pain that does not go away or get better with medicine. ? Chest pain. ? Difficulty breathing. ? Blurred vision or spots in your vision. ? Thoughts about hurting yourself or your baby. ? New pain in your abdomen or in one of your legs. ? A severe headache.  You faint.  You bleed from your vagina so much that you fill more than one sanitary pad in one hour. Bleeding should not be heavier than your heaviest period. Summary  After the procedure, it is common to have pain at your incision site, abdominal cramping, and slight bleeding from your vagina.  Check  your incision area every day for signs of infection.  Tell your health care provider about any unusual symptoms.  Keep all follow-up visits for you and your baby as told by your health care provider. This information is not intended to replace advice given to you by your health care provider. Make sure you discuss any questions you have with your health care provider. Document Revised: 08/04/2017 Document Reviewed: 08/04/2017 Elsevier Patient Education  2020 [employer].  

## 2019-12-16 NOTE — Progress Notes (Signed)
  Labor Progress Note   24 y.o. G1P0 @ [redacted]w[redacted]d , admitted for  Pregnancy, Labor Management.   Subjective:  No pain after epidural  Objective:  BP 140/73   Pulse 87   Temp 98 F (36.7 C) (Oral)   Resp 16   Ht 5\' 5"  (1.651 m)   Wt 110.2 kg   LMP  (LMP Unknown)   SpO2 98%   BMI 40.44 kg/m  Abd: gravid, ND, FHT present, mild tenderness on exam Extr: trace to 1+ bilateral pedal edema SVE: 7/80/-2 AROM clear  EFM: FHR: 140 bpm, variability: moderate,  accelerations:  Present,  decelerations:  Absent Toco: Frequency: Every 3-4 minutes Labs: I have reviewed the patient's lab results.   Assessment & Plan:  G1P0 @ [redacted]w[redacted]d, admitted for  Pregnancy and Labor/Delivery Management  1. Pain management: epidural. 2. FWB: FHT category 1.  3. ID: GBS negative 4. Labor management: Cont active mgt of labor  All discussed with patient, see orders  [redacted]w[redacted]d, MD, Annamarie Major Ob/Gyn, [facility] 12/16/2019  9:53 AM

## 2019-12-16 NOTE — Progress Notes (Signed)
  Labor Progress Note   24 y.o. G1P0 @ [redacted]w[redacted]d , admitted for  Pregnancy, Labor Management.   Subjective:  Min pain although feels pressure to push Has been pushing for 2 hours w min descent  Objective:  BP (!) 150/90   Pulse (!) 110   Temp 99.5 F (37.5 C) (Oral)   Resp 16   Ht 5\' 5"  (1.651 m)   Wt 110.2 kg   LMP  (LMP Unknown)   SpO2 100%   BMI 40.44 kg/m  Abd: gravid, ND, FHT present, mild tenderness on exam Extr: trace to 1+ bilateral pedal edema SVE: Unchanged with station high and caput present FHT 140s Ctxs q 2 min and adequate per IUPC  Assessment & Plan:  G1P0 @ [redacted]w[redacted]d, admitted for  Pregnancy and Labor/Delivery Management  1. Pain management: epidural. 2. FWB: FHT category 1.  3. ID: GBS negative 4. Labor management: Protracted labor, arrest of descent Counseled on options.  Not recommended for operative vaginal delivery. Cesarean section discussed and agreed upon, Informed consent.  The risks of cesarean section discussed with the patient included but were not limited to: bleeding which may require transfusion or reoperation; infection which may require antibiotics; injury to bowel, bladder, ureters or other surrounding organs; injury to the fetus; need for additional procedures including hysterectomy in the event of a life-threatening hemorrhage; placental abnormalities wth subsequent pregnancies, incisional problems, thromboembolic phenomenon and other postoperative/anesthesia complications. The patient concurred with the proposed plan, giving informed written consent for the procedure.   All discussed with patient, see orders  [redacted]w[redacted]d, MD, Annamarie Major Ob/Gyn, [facility] 12/16/2019  8:54 PM

## 2019-12-16 NOTE — Anesthesia Procedure Notes (Signed)
Epidural Patient location during procedure: OB Start time: 12/16/2019 9:14 AM End time: 12/16/2019 9:21 AM  Staffing Anesthesiologist: Whittney Steenson, Cleda Mccreedy, MD Performed: anesthesiologist   Preanesthetic Checklist Completed: patient identified, IV checked, site marked, risks and benefits discussed, surgical consent, monitors and equipment checked, pre-op evaluation and timeout performed  Epidural Patient position: sitting Prep: ChloraPrep Patient monitoring: heart rate, continuous pulse ox and blood pressure Approach: midline Location: L3-L4 Injection technique: LOR saline  Needle:  Needle type: Tuohy  Needle gauge: 17 G Needle length: 9 cm and 9 Needle insertion depth: 9 cm Catheter type: closed end flexible Catheter size: 19 Gauge Catheter at skin depth: 15 cm Test dose: negative and 1.5% lidocaine with Epi 1:200 K  Assessment Sensory level: T10 Events: blood not aspirated, injection not painful, no injection resistance, no paresthesia and negative IV test  Additional Notes 2 attempts Pt. Evaluated and documentation done after procedure finished. Patient identified. Risks/Benefits/Options discussed with patient including but not limited to bleeding, infection, nerve damage, paralysis, failed block, incomplete pain control, headache, blood pressure changes, nausea, vomiting, reactions to medication both or allergic, itching and postpartum back pain. Confirmed with bedside nurse the patient's most recent platelet count. Confirmed with patient that they are not currently taking any anticoagulation, have any bleeding history or any family history of bleeding disorders. Patient expressed understanding and wished to proceed. All questions were answered. Sterile technique was used throughout the entire procedure. Please see nursing notes for vital signs. Test dose was given through epidural catheter and negative prior to continuing to dose epidural or start infusion. Warning signs of  high block given to the patient including shortness of breath, tingling/numbness in hands, complete motor block, or any concerning symptoms with instructions to call for help. Patient was given instructions on fall risk and not to get out of bed. All questions and concerns addressed with instructions to call with any issues or inadequate analgesia.   Patient tolerated the insertion well without immediate complications.Reason for block:procedure for pain

## 2019-12-16 NOTE — Progress Notes (Signed)
Progress Note  Patient denies recent outbreaks or prodromal symptoms of HSV. No vvidence of herpes lesions on external vulvar and speculum exam.   SVE: 5/90/-3  NST: 135 bpm baseline, moderate variability, accelerations present, no decelerations. Tocometer : q 2-3 minutes  Patient consider pain medication options.   Adelene Idler MD, [name] OB/GYN, [facility] 12/16/2019 2:14 AM

## 2019-12-16 NOTE — Op Note (Addendum)
Cesarean Section Procedure Note Indications: cephalo-pelvic disproportion, failure to progress: arrest of descent and term intrauterine pregnancy  Pre-operative Diagnosis:   cephalo-pelvic disproportion, failure to progress: arrest of descent and term intrauterine pregnancy Post-operative Diagnosis: same, delivered. Procedure: Low Transverse Cesarean Section Surgeon: Annamarie Major, MD, FACOG Anesthesia: General endotracheal anesthesia and Spinal anesthesia Estimated Blood Loss:785 mL Complications: None; patient tolerated the procedure well. Disposition: PACU - hemodynamically stable. Condition: stable  Findings: A female infant in the cephalic presentation.  "Maria Raymond" Amniotic fluid - Meconium  Birth weight - 7 lbs 12 oz  Apgars of 1 and 8.   PH 7.14. Intact placenta with a three-vessel cord. Grossly normal uterus, tubes and ovaries bilaterally. No intraabdominal adhesions were noted.  Procedure Details   The patient was taken to Operating Room, identified as the correct patient and the procedure verified as C-Section Delivery. A Time Out was held and the above information confirmed. After induction of anesthesia, the patient was draped and prepped in the usual sterile manner. A Pfannenstiel incision was made and carried down through the subcutaneous tissue to the fascia. Fascial incision was made and extended transversely with the Mayo scissors. The fascia was separated from the underlying rectus tissue superiorly and inferiorly. The peritoneum was identified and entered bluntly. Peritoneal incision was extended longitudinally. The utero-vesical peritoneal reflection was incised transversely and a bladder flap was created digitally.  A low transverse hysterotomy was made. The fetus was delivered atraumatically. The umbilical cord was clamped x2 and cut and the infant was handed to the awaiting pediatricians. The placenta was removed intact and appeared normal with a 3-vessel cord.  The uterus  was exteriorized and cleared of all clot and debris. The hysterotomy was closed with running sutures of 0 Vicryl suture. A second imbricating layer was placed with the same suture. Excellent hemostasis was observed. The uterus was returned to the abdomen. The pelvis was irrigated and again, excellent hemostasis was noted.  The On Q Pain pump System was then placed.  Trocars were placed through the abdominal wall into the subfascial space and these were used to thread the silver soaker cathaters into place.The rectus fascia was then reapproximated with running sutures of Maxon, with careful placement not to incorporate the cathaters. Subcutaneous tissues are then irrigated with saline and hemostasis assured.  Skin is then closed with 4-0 vicryl suture in a subcuticular fashion followed by skin adhesive. The cathaters are flushed each with 5 mL of Bupivicaine and stabilized into place with dressing. Instrument, sponge, and needle counts were correct prior to the abdominal closure and at the conclusion of the case.  The patient tolerated the procedure well and was transferred to the recovery room in stable condition.   Annamarie Major, MD, [name] Ob/Gyn, [facility] 12/16/2019  11:01 PM

## 2019-12-16 NOTE — H&P (Signed)
H&P  Maria Raymond is an 24 y.o. female.  HPI: She presents today with complaints of contractions. She reports possible leakage of fluid. She reports normal fetal movement. She denies vaginal bleeding.   Pregnancy has been complicated by genital herpes for which patient is taking valtrex.  She has had a weight gain of > 50 lbs this pregnancy.  There was fetal pelviectasis noted, but this was normal at her 32 week ultrasound. She had trichomoniasis early in her pregnancy which was treated.   On 11/28/2019 infant was estimated to weight 3522 grams Growth percentile is 84.8%.  AC percentile is >97.7%.    pregnancy1  Problems (from 04/09/19 to present)    Problem Noted Resolved   Genital herpes  by Tresea Mall, CNM No   Excessive weight gain during pregnancy in third trimester 11/10/2019 by Tresea Mall, CNM No   Encounter for supervision of normal first pregnancy in third trimester 06/23/2019 by Tresea Mall, CNM No   Overview Addendum 12/16/2019 12:53 AM by Natale Milch, MD    Clinic [facility] Prenatal Labs  Dating By [redacted]w[redacted]d u/s at [facility] on 4/22 Blood type: A/Positive/-- (06/11 1048)   Genetic Screen Declined Antibody:Negative (06/11 1048)  Anatomic US Fetal pelviectasis, bilaterally Rubella: 2.45 (06/11 1048)  Varicella: Immune  GTT Early:   111             Third trimester: 121 RPR: Non Reactive (06/11 1048)   Rhogam  not needed HBsAg: Negative (06/11 1048)   Vaccines TDAP:    11/10/2019                Flu Shot: HIV: Non Reactive (06/11 1048)   Baby Food Breast and Bottle                  GBS:  negative GC/CT: neg/neg. Pos Trich  Contraception  Pap: 1st PAP smear 06/23/19: NIL  CBB     CS/VBAC NA   Support Person Mother Lowella Bandy          Previous Version        Past Medical History:  Diagnosis Date  . Depression   . Genital herpes     Past Surgical History:  Procedure Laterality Date  . TONSILLECTOMY      History reviewed. No pertinent family  history.  Social History:  reports that she has been smoking cigarettes. She has been smoking about 0.25 packs per day. She has never used smokeless tobacco. She reports current alcohol use. She reports that she does not use drugs.  Allergies: No Known Allergies  Medications: I have reviewed the patient's current medications.  No results found for this or any previous visit (from the past 48 hour(s)).  No results found.  Review of Systems  Constitutional: Negative for chills and fever.  HENT: Negative for congestion, hearing loss and sinus pain.   Respiratory: Negative for cough, shortness of breath and wheezing.   Cardiovascular: Negative for chest pain, palpitations and leg swelling.  Gastrointestinal: Negative for abdominal pain, constipation, diarrhea, nausea and vomiting.  Genitourinary: Negative for dysuria, flank pain, frequency, hematuria and urgency.  Musculoskeletal: Negative for back pain.  Skin: Negative for rash.  Neurological: Negative for dizziness and headaches.  Psychiatric/Behavioral: Negative for suicidal ideas. The patient is not nervous/anxious.    Blood pressure (!) 144/79, pulse 100. Physical Exam Vitals and nursing note reviewed.  Constitutional:      Appearance: She is well-developed.  HENT:     Head:  Normocephalic and atraumatic.  Cardiovascular:     Rate and Rhythm: Normal rate and regular rhythm.  Pulmonary:     Effort: Pulmonary effort is normal.     Breath sounds: Normal breath sounds.  Abdominal:     General: Bowel sounds are normal.     Palpations: Abdomen is soft.  Musculoskeletal:        General: Normal range of motion.  Skin:    General: Skin is warm and dry.  Neurological:     Mental Status: She is alert and oriented to person, place, and time.  Psychiatric:        Behavior: Behavior normal.        Thought Content: Thought content normal.        Judgment: Judgment normal.    NST: 150 bpm baseline, moderate variability, 15x15  accelerations, no decelerations. Tocometer : every 2-3 minutes  Assessment/Plan: 24 y.o. G1P0 at [redacted]w[redacted]d  in early labor 1. Will admit for expectant management of labor.  2. Clear liquid diet 3. GBS negative 4. Epidural PRN 5. Elevated blood pressures, moderate range. Will check pre-e labs.   Alexzia Kasler R Kirstine Jacquin 12/16/2019, 12:56 AM

## 2019-12-16 NOTE — Transfer of Care (Signed)
Immediate Anesthesia Transfer of Care Note  Patient: Maria Raymond  Procedure(s) Performed: CESAREAN SECTION  Patient Location: PACU  Anesthesia Type:General  Level of Consciousness: awake, alert  and oriented  Airway & Oxygen Therapy: Patient Spontanous Breathing and Patient connected to nasal cannula oxygen  Post-op Assessment: Report given to RN and Post -op Vital signs reviewed and stable  Post vital signs: Reviewed and stable  Last Vitals:  Vitals Value Taken Time  BP 133/66   Temp 36.6   Pulse 127 12/16/19 2310  Resp 20 12/16/19 2310  SpO2 98 % 12/16/19 2310  Vitals shown include unvalidated device data.  Last Pain:  Vitals:   12/16/19 2029  TempSrc: Oral  PainSc:          Complications: No complications documented.

## 2019-12-17 ENCOUNTER — Encounter: Payer: Self-pay | Admitting: Obstetrics and Gynecology

## 2019-12-17 LAB — CBC
HCT: 29.7 % — ABNORMAL LOW (ref 36.0–46.0)
Hemoglobin: 9.5 g/dL — ABNORMAL LOW (ref 12.0–15.0)
MCH: 27 pg (ref 26.0–34.0)
MCHC: 32 g/dL (ref 30.0–36.0)
MCV: 84.4 fL (ref 80.0–100.0)
Platelets: 215 10*3/uL (ref 150–400)
RBC: 3.52 MIL/uL — ABNORMAL LOW (ref 3.87–5.11)
RDW: 15.5 % (ref 11.5–15.5)
WBC: 22.8 10*3/uL — ABNORMAL HIGH (ref 4.0–10.5)
nRBC: 0 % (ref 0.0–0.2)

## 2019-12-17 MED ORDER — OXYTOCIN-SODIUM CHLORIDE 30-0.9 UT/500ML-% IV SOLN
2.5000 [IU]/h | INTRAVENOUS | Status: AC
  Administered 2019-12-17: 2.5 [IU]/h via INTRAVENOUS
  Filled 2019-12-17: qty 500

## 2019-12-17 MED ORDER — SIMETHICONE 80 MG PO CHEW
80.0000 mg | CHEWABLE_TABLET | Freq: Three times a day (TID) | ORAL | Status: DC
  Administered 2019-12-17 – 2019-12-18 (×5): 80 mg via ORAL
  Filled 2019-12-17 (×5): qty 1

## 2019-12-17 MED ORDER — IBUPROFEN 800 MG PO TABS
800.0000 mg | ORAL_TABLET | Freq: Four times a day (QID) | ORAL | Status: DC
  Administered 2019-12-17 – 2019-12-18 (×4): 800 mg via ORAL
  Filled 2019-12-17 (×3): qty 1

## 2019-12-17 MED ORDER — ZOLPIDEM TARTRATE 5 MG PO TABS: 5.0000 mg | ORAL_TABLET | Freq: Every evening | ORAL | Status: DC | PRN

## 2019-12-17 MED ORDER — SIMETHICONE 80 MG PO CHEW
80.0000 mg | CHEWABLE_TABLET | ORAL | Status: DC
  Administered 2019-12-17 – 2019-12-18 (×2): 80 mg via ORAL
  Filled 2019-12-17 (×2): qty 1

## 2019-12-17 MED ORDER — KETOROLAC TROMETHAMINE 30 MG/ML IJ SOLN
30.0000 mg | Freq: Four times a day (QID) | INTRAMUSCULAR | Status: AC
  Administered 2019-12-17 (×3): 30 mg via INTRAVENOUS
  Filled 2019-12-17 (×3): qty 1

## 2019-12-17 MED ORDER — DIPHENHYDRAMINE HCL 25 MG PO CAPS: 25.0000 mg | ORAL_CAPSULE | Freq: Four times a day (QID) | ORAL | Status: DC | PRN

## 2019-12-17 MED ORDER — OXYCODONE HCL 5 MG PO TABS
5.0000 mg | ORAL_TABLET | ORAL | Status: DC | PRN
  Administered 2019-12-17 – 2019-12-18 (×4): 5 mg via ORAL
  Filled 2019-12-17 (×4): qty 1

## 2019-12-17 MED ORDER — BUPIVACAINE ON-Q PAIN PUMP (FOR ORDER SET NO CHG)
INJECTION | Status: DC
  Filled 2019-12-17: qty 1

## 2019-12-17 MED ORDER — MENTHOL 3 MG MT LOZG
1.0000 | LOZENGE | OROMUCOSAL | Status: DC | PRN
  Filled 2019-12-17: qty 9

## 2019-12-17 MED ORDER — WITCH HAZEL-GLYCERIN EX PADS: 1.0000 "application " | MEDICATED_PAD | CUTANEOUS | Status: DC | PRN

## 2019-12-17 MED ORDER — SENNOSIDES-DOCUSATE SODIUM 8.6-50 MG PO TABS
2.0000 | ORAL_TABLET | ORAL | Status: DC
  Administered 2019-12-17 – 2019-12-18 (×3): 2 via ORAL
  Filled 2019-12-17 (×2): qty 2

## 2019-12-17 MED ORDER — LACTATED RINGERS IV SOLN: INTRAVENOUS | Status: DC

## 2019-12-17 MED ORDER — OXYCODONE-ACETAMINOPHEN 5-325 MG PO TABS
1.0000 | ORAL_TABLET | ORAL | Status: DC | PRN
  Administered 2019-12-17: 1 via ORAL
  Administered 2019-12-17 (×4): 2 via ORAL
  Filled 2019-12-17 (×6): qty 2

## 2019-12-17 MED ORDER — COCONUT OIL OIL: 1.0000 "application " | TOPICAL_OIL | Status: DC | PRN

## 2019-12-17 MED ORDER — PRENATAL MULTIVITAMIN CH
1.0000 | ORAL_TABLET | Freq: Every day | ORAL | Status: DC
  Administered 2019-12-17 – 2019-12-18 (×2): 1 via ORAL
  Filled 2019-12-17 (×2): qty 1

## 2019-12-17 MED ORDER — DIBUCAINE (PERIANAL) 1 % EX OINT: 1.0000 "application " | TOPICAL_OINTMENT | CUTANEOUS | Status: DC | PRN

## 2019-12-17 MED ORDER — SIMETHICONE 80 MG PO CHEW
80.0000 mg | CHEWABLE_TABLET | ORAL | Status: DC | PRN
  Administered 2019-12-17: 80 mg via ORAL
  Filled 2019-12-17: qty 1

## 2019-12-17 MED ORDER — MORPHINE SULFATE (PF) 2 MG/ML IV SOLN: 1.0000 mg | INTRAVENOUS | Status: DC | PRN

## 2019-12-17 MED ORDER — ACETAMINOPHEN 325 MG PO TABS
650.0000 mg | ORAL_TABLET | ORAL | Status: DC | PRN
  Administered 2019-12-18 (×2): 650 mg via ORAL
  Filled 2019-12-17 (×2): qty 2

## 2019-12-17 NOTE — Anesthesia Postprocedure Evaluation (Signed)
Anesthesia Post Note  Patient: Maria Raymond  Procedure(s) Performed: CESAREAN SECTION  Patient location during evaluation: Mother Baby Anesthesia Type: Epidural, Spinal and General Level of consciousness: awake and alert Pain management: pain level controlled Vital Signs Assessment: post-procedure vital signs reviewed and stable Respiratory status: spontaneous breathing, nonlabored ventilation and respiratory function stable Cardiovascular status: stable Postop Assessment: no headache, no backache and epidural receding Anesthetic complications: no   No complications documented.   Last Vitals:  Vitals:   12/17/19 0313 12/17/19 0806  BP: 121/81 132/89  Pulse: (!) 105 (!) 104  Resp: 18 20  Temp: 36.8 C 36.8 C  SpO2: 99% 99%    Last Pain:  Vitals:   12/17/19 1001  TempSrc:   PainSc: 10-Worst pain ever                 Corinda Gubler

## 2019-12-17 NOTE — Plan of Care (Signed)
Alert and oriented with pleasant affect. Color good, skin w&d. BBS clear. IS level achieved is 1250;Encouraged Pt. To use IS q1h to prevent Pneumonia and she v/o and demonstrates correct technique.  Pt. Is ambulating with steady gait. She does rate her incisional pain as a 7;however, her Percocet is not due until 2200 and pt. Was informed of this and she states she is comfortable to wait until that time. Fundus is firm at U/-1 with small amount of [place]och[place] Flow. Tolerating Regular diet. I instructed Mom in importance of wearing a bra if she was going to Formula feed. Pt. Stated she still may wants to put Infant to breast. I instructed Patient that milk production is based on stimulation of her breast either through Nursing and/or breast feeding. I encouraged her to call me for assist at next feeding so that I could help infant latch. I also instructed Mom on how to [facility] breast Milk and she demonstrates understanding via Teach back. Will cont., to Educate and support Mom's feeding choices. I also ask S. Williams RN to consult with Pt. When she is here tomorrow and she states she will meet with Pt.

## 2019-12-17 NOTE — Progress Notes (Signed)
   12/16/19 2049  Provider Notification  Provider Name/Title Dr. Tiburcio Pea  Method of Notification Phone  Request Evaluate in person  Notification Reason Patient request;Other (Comment) (Increased temp, and arrest of descent while puhsing.)   Patient in room crying and requesting to have a c-section due to fatigue and lack of significant progress while pushing. Dr. Tiburcio Pea Physician notified by phone as he stated he will be up to evaluate patient.

## 2019-12-17 NOTE — Plan of Care (Signed)
Pt. Transferred to Room 339 Post C/S. Alert and oriented with aporp affect. C/O feeling "Hungry" Saltines and Applesauce Provided. Tolerated well.Oriented to Room, POC and I.S instruction provided and Pt. V/O. C/O pain and Toradol given as per scheduled order. Pt. V/o of TCDB q2h R/T prevention of Pneumonia and Decreased bowel motility post General anesthesia and C/S. Pt. V/O. Infant is in SCN for observation. Irven Coe NP is going to update Parents on Infant Statis. Mom states she is only going to Bottle Feed. I educated her on Pumping and Benefits of Breast Feeding and she stated she does not wish to Breast Feed.

## 2019-12-17 NOTE — Progress Notes (Signed)
Admit Date: 12/15/2019 Today's Date: 12/17/2019  Subjective: Postpartum Day 1: Cesarean Delivery Patient reports incisional pain, tolerating PO and no problems voiding.    Objective: Vital signs in last 24 hours: Temp:  [97.7 F (36.5 C)-99.6 F (37.6 C)] 98.3 F (36.8 C) (11/07 0806) Pulse Rate:  [85-138] 104 (11/07 0806) Resp:  [16-22] 20 (11/07 0806) BP: (115-161)/(59-99) 132/89 (11/07 0806) SpO2:  [97 %-100 %] 99 % (11/07 0806)  Physical Exam:  General: alert, cooperative and no distress Lochia: appropriate Uterine Fundus: firm Incision: healing well, no significant drainage, no dehiscence, no significant erythema DVT Evaluation: No evidence of DVT seen on physical exam. Negative Homan's sign. No cords or calf tenderness.  Recent Labs    12/16/19 0109 12/17/19 0601  HGB 12.3 9.5*  HCT 38.3 29.7*    Assessment/Plan: Status post Cesarean section. Doing well postoperatively.  Continue current care. Monitor anemia.  Fe. Bottle feeding. Infant doing well, in room. Uncertain contraception plans.  Pros and cons of all options discussed.  Letitia Libra 12/17/2019, 9:52 AM

## 2019-12-18 ENCOUNTER — Encounter: Payer: Self-pay | Admitting: Obstetrics & Gynecology

## 2019-12-18 MED ORDER — OXYCODONE HCL 5 MG PO TABS: 5.0000 mg | ORAL_TABLET | Freq: Four times a day (QID) | ORAL | 0 refills | Status: DC | PRN

## 2019-12-18 MED ORDER — MEDROXYPROGESTERONE ACETATE 150 MG/ML IM SUSP
150.0000 mg | Freq: Once | INTRAMUSCULAR | Status: AC
  Administered 2019-12-18: 150 mg via INTRAMUSCULAR
  Filled 2019-12-18 (×2): qty 1

## 2019-12-18 NOTE — Progress Notes (Signed)
Pt. Has breast fed X2 tonight with supplementation of formula per Mom's desire. Infant Achieved an effective latch with strong suck.

## 2019-12-18 NOTE — Progress Notes (Signed)
Obstetric Postpartum/PostOperative Daily Progress Note Subjective:  24 y.o. G1P1001 post-operative day # 2 status post primary cesarean delivery.  She is ambulating, is tolerating po, is voiding spontaneously.  Her pain is controlled on PO pain medications and On Q pump. She denies any current chest pain. Her lochia is less than menses. She reports her breasts are filling. She is pumping and also formula feeding.   Medications SCHEDULED MEDICATIONS  . ibuprofen  800 mg Oral Q6H  . prenatal multivitamin  1 tablet Oral Q1200  . senna-docusate  2 tablet Oral Q24H  . simethicone  80 mg Oral TID PC  . simethicone  80 mg Oral Q24H    MEDICATION INFUSIONS  . bupivacaine 0.25 % ON-Q pump DUAL CATH 400 mL    . bupivacaine ON-Q pain pump    . lactated ringers      PRN MEDICATIONS  acetaminophen, coconut oil, witch hazel-glycerin **AND** dibucaine, diphenhydrAMINE, menthol-cetylpyridinium, morphine injection, oxyCODONE, simethicone, zolpidem    Objective:   Vitals:   12/17/19 1913 12/18/19 0028 12/18/19 0637 12/18/19 0804  BP: 134/72 129/84 138/85 136/74  Pulse: 61 71 (!) 108 (!) 105  Resp: 18 18 20 20   Temp: 98.5 F (36.9 C) 98.4 F (36.9 C) 98.7 F (37.1 C) 98.7 F (37.1 C)  TempSrc: Oral Oral  Oral  SpO2: 100% 100% 100% 99%  Weight:      Height:        Current Vital Signs 24h Vital Sign Ranges  T 98.7 F (37.1 C) Temp  Avg: 98.6 F (37 C)  Min: 98 F (36.7 C)  Max: 99 F (37.2 C)  BP 136/74 BP  Min: 127/78  Max: 138/85  HR (!) 105 Pulse  Avg: 91.5  Min: 61  Max: 108  RR 20 Resp  Avg: 18.7  Min: 18  Max: 20  SaO2 99 % Room Air SpO2  Avg: 99.3 %  Min: 98 %  Max: 100 %       24 Hour I/O Current Shift I/O  Time Ins Outs 11/07 0701 - 11/08 0700 In: 10 [P.O.:10] Out: 300 [Urine:300] No intake/output data recorded.  General: NAD Pulmonary: no increased work of breathing Abdomen: non-distended, non-tender, fundus firm at level of umbilicus Inc: Clean/dry/intact, On Q  intact Extremities: no edema, no erythema, no tenderness  Labs:  Recent Labs  Lab 12/16/19 0109 12/17/19 0601  WBC 14.9* 22.8*  HGB 12.3 9.5*  HCT 38.3 29.7*  PLT 248 215     Assessment:   24 y.o. G1P1001 postoperative day # 2 status post primary cesarean section, lactating  Plan:  1) Acute blood loss anemia - hemodynamically stable and asymptomatic - po ferrous sulfate  2) A POS Performed at [facility], [address] [place], [place]  / [address] 2.45 (06/11 1048)/ Varicella Immune  3) TDAP status given antepartum  4) Breast and formula feeding/Contraception = Depo-Provera   5) Ambulation encouraged  6) Disposition: continue current care   01-03-1969, CNM 12/18/2019 10:05 AM

## 2019-12-18 NOTE — Progress Notes (Signed)
Discharge instructions reviewed with patient who verbalized understanding.  Follow up reviewed.  Patient denies any further questions at this time.  Discharged home with significant other and infant.

## 2019-12-18 NOTE — Progress Notes (Signed)
Called to see patient due to some pain in her abdomen as well as her chest area. Pt reports she is fatigued from lack of sleep and care for newborn. She does not feel ready to go home. Her pain in chest is mild and does not radiate and is not associated with any SOB or other sx's. VSS and exam reassuring Also, incision healing well Plan- cont to monitor for any persistence or worsening of chest pain Consider CT chest if persists  Annamarie Major, MD, [name], [facility] 12/18/2019  6:56 AM

## 2019-12-18 NOTE — Progress Notes (Addendum)
Pt. With c/o "Tightness" in her left upper chest. Alert and oriented with pleasant affect. Color good, skin w&d. BBS clear. 98.7, 20, 106, 138/85 and O2 Sat. 100% on R/A. Dr. Tiburcio Pea stated he would assess Pt. Pt. Informed and instructed to call RN if increasing discomfort, SOB or any other c/o.

## 2019-12-18 NOTE — Progress Notes (Signed)
Dr. Cindi Carbon here to see Pt.

## 2019-12-19 ENCOUNTER — Other Ambulatory Visit: Payer: Self-pay | Admitting: Obstetrics & Gynecology

## 2019-12-19 ENCOUNTER — Encounter: Payer: Medicaid Other | Admitting: Obstetrics and Gynecology

## 2019-12-19 MED ORDER — HYDROCODONE-ACETAMINOPHEN 7.5-325 MG PO TABS: 1.0000 | ORAL_TABLET | Freq: Four times a day (QID) | ORAL | 0 refills | Status: DC | PRN

## 2019-12-19 NOTE — Telephone Encounter (Signed)
Pt aware.

## 2019-12-19 NOTE — Telephone Encounter (Signed)
Please advise 

## 2019-12-20 ENCOUNTER — Ambulatory Visit: Payer: Medicaid Other | Admitting: Obstetrics & Gynecology

## 2019-12-20 NOTE — Telephone Encounter (Signed)
Pt called office to ask about her c/s - to cover it up or let it air out.  Adv to let it air out.  She asked about cleaning it with witch hazel.  Adv it would probably burn; to wash gently with soap and water, pat dry, use hair dryer to dry it more thoroughly.

## 2019-12-20 NOTE — Telephone Encounter (Signed)
Please advise 

## 2019-12-22 ENCOUNTER — Ambulatory Visit: Payer: Medicaid Other | Admitting: Obstetrics & Gynecology

## 2019-12-22 ENCOUNTER — Ambulatory Visit (INDEPENDENT_AMBULATORY_CARE_PROVIDER_SITE_OTHER): Payer: Medicaid Other | Admitting: Obstetrics & Gynecology

## 2019-12-22 ENCOUNTER — Other Ambulatory Visit: Payer: Self-pay

## 2019-12-22 ENCOUNTER — Encounter: Payer: Self-pay | Admitting: Obstetrics & Gynecology

## 2019-12-22 VITALS — BP 130/90 | Ht 65.0 in | Wt 233.0 lb

## 2019-12-22 DIAGNOSIS — Z98891 History of uterine scar from previous surgery: Secondary | ICD-10-CM

## 2019-12-22 MED ORDER — HYDROCODONE-ACETAMINOPHEN 7.5-325 MG PO TABS: 1.0000 | ORAL_TABLET | Freq: Four times a day (QID) | ORAL | 0 refills | Status: DC | PRN

## 2019-12-22 MED ORDER — CEPHALEXIN 500 MG PO CAPS: 500.0000 mg | ORAL_CAPSULE | Freq: Four times a day (QID) | ORAL | 2 refills | Status: DC

## 2019-12-22 MED ORDER — IBUPROFEN 800 MG PO TABS: 800.0000 mg | ORAL_TABLET | Freq: Four times a day (QID) | ORAL | 6 refills | Status: DC | PRN

## 2019-12-22 MED ORDER — HYDROCHLOROTHIAZIDE 12.5 MG PO TABS: 12.5000 mg | ORAL_TABLET | Freq: Every day | ORAL | 1 refills | Status: DC | PRN

## 2019-12-22 NOTE — Progress Notes (Signed)
  Postoperative Follow-up Patient presents post op from recent Cesarean Section performed for Arrest of Descent, 1 week ago.   Subjective: Patient reports little improvement in her immediate post op symptoms- still having lower abd pain, edema, swelling where OnQ was located.  Eating a regular diet without difficulty. Pain is not well controlled.  Medications being used: acetaminophen and narcotic analgesics including Vicodin.  Activity: sedentary. Patient reports additional symptom's since surgery of appropriate lochia, no signs of depression, and no signs of mastitis.  Objective: BP 130/90   Ht 5\' 5"  (1.651 m)   Wt 233 lb (105.7 kg)   LMP  (LMP Unknown)   BMI 38.77 kg/m  Physical Exam Constitutional:      General: She is not in acute distress.    Appearance: She is well-developed.  Cardiovascular:     Rate and Rhythm: Normal rate.  Pulmonary:     Effort: Pulmonary effort is normal.  Abdominal:     General: There is no distension.     Palpations: Abdomen is soft.     Tenderness: There is no abdominal tenderness.     Comments: Incision Healing Well Mid abd edema where OnQ located, mild erythema and warmth  Musculoskeletal:        General: Normal range of motion.  Neurological:     Mental Status: She is alert and oriented to person, place, and time.     Cranial Nerves: No cranial nerve deficit.  Skin:    General: Skin is warm and dry.     Assessment: s/p : Cesarean Section for Arrest of Descent stable  Plan: Patient has done well after her Cesarean Section with no apparent complications.  I have discussed the post-operative course to date, and the expected progress moving forward.  The patient understands what complications to be concerned about.  I will see the patient in routine follow up, or sooner if needed.    Activity plan: No heavy lifting.  Pelvic rest. She desires Depo-Provera for postpartum contraception (done in hospital firrst dose)  Plan Keflex for skin  cellulitis (mild) Plan HCTZ brief course for edema Plan refill Norco as well as IBF for pain  Marland Kitchen 12/22/2019, 11:31 AM

## 2019-12-25 ENCOUNTER — Other Ambulatory Visit: Payer: Self-pay | Admitting: Obstetrics & Gynecology

## 2019-12-25 MED ORDER — OXYCODONE-ACETAMINOPHEN 5-325 MG PO TABS
1.0000 | ORAL_TABLET | ORAL | 0 refills | Status: DC | PRN
Start: 2019-12-25 — End: 2020-01-01

## 2019-12-25 MED ORDER — TRAMADOL HCL 50 MG PO TABS
50.0000 mg | ORAL_TABLET | Freq: Four times a day (QID) | ORAL | 0 refills | Status: DC | PRN
Start: 2019-12-25 — End: 2019-12-25

## 2019-12-25 NOTE — Telephone Encounter (Signed)
Pt wants different pain meds, states she just had an appointment.

## 2019-12-25 NOTE — Telephone Encounter (Signed)
Please advise 

## 2019-12-30 ENCOUNTER — Other Ambulatory Visit: Payer: Self-pay

## 2020-01-01 ENCOUNTER — Other Ambulatory Visit: Payer: Self-pay | Admitting: Obstetrics & Gynecology

## 2020-01-01 MED ORDER — OXYCODONE-ACETAMINOPHEN 5-325 MG PO TABS: 1.0000 | ORAL_TABLET | ORAL | 0 refills | Status: DC | PRN

## 2020-01-02 NOTE — Telephone Encounter (Signed)
Couldn't reply in this msg to chart, sent reply in another mychart msg.

## 2020-01-19 ENCOUNTER — Telehealth: Payer: Self-pay | Admitting: Obstetrics & Gynecology

## 2020-01-19 NOTE — Telephone Encounter (Signed)
Ok to sch appt postpartum

## 2020-01-19 NOTE — Telephone Encounter (Signed)
Pt calling requesting earlier pp apt so she can return to work sooner. Please advise.

## 2020-01-22 ENCOUNTER — Other Ambulatory Visit: Payer: Self-pay

## 2020-01-22 ENCOUNTER — Other Ambulatory Visit (HOSPITAL_COMMUNITY)
Admission: RE | Admit: 2020-01-22 | Discharge: 2020-01-22 | Disposition: A | Discharge status: Home / Self Care | Payer: Medicaid Other | Source: Ambulatory Visit | Attending: Obstetrics & Gynecology | Admitting: Obstetrics & Gynecology

## 2020-01-22 ENCOUNTER — Encounter: Payer: Self-pay | Admitting: Obstetrics & Gynecology

## 2020-01-22 ENCOUNTER — Ambulatory Visit (INDEPENDENT_AMBULATORY_CARE_PROVIDER_SITE_OTHER): Payer: Medicaid Other | Admitting: Obstetrics & Gynecology

## 2020-01-22 DIAGNOSIS — Z113 Encounter for screening for infections with a predominantly sexual mode of transmission: Secondary | ICD-10-CM

## 2020-01-22 MED ORDER — MEDROXYPROGESTERONE ACETATE 150 MG/ML IM SUSP
150.0000 mg | INTRAMUSCULAR | 3 refills | Status: DC
Start: 2020-01-22 — End: 2020-08-23

## 2020-01-22 NOTE — Addendum Note (Signed)
Addended by: Nadara Mustard on: 01/22/2020 02:44 PM   Modules accepted: Orders

## 2020-01-22 NOTE — Addendum Note (Signed)
Addended by: Nadara Mustard on: 01/22/2020 03:29 PM   Modules accepted: Orders

## 2020-01-22 NOTE — Telephone Encounter (Signed)
Patient is scheduled for 01/22/20 at 2:20 with [facility]

## 2020-01-22 NOTE — Progress Notes (Signed)
  OBSTETRICS POSTPARTUM CLINIC PROGRESS NOTE  Subjective:     Maria Raymond is a 24 y.o. G22P1001 female who presents for a postpartum visit. She is 5 weeks postpartum following a Term pregnancy and delivery by C-section failure to progress.  I have fully reviewed the prenatal and intrapartum course. Anesthesia: epidural.  Postpartum course has been complicated by uncomplicated.  Baby is feeding by Bottle.  Bleeding: patient has not  resumed menses.  Bowel function is normal. Bladder function is normal.  Patient is not sexually active. Contraception method desired is Depo-Provera injections.  Postpartum depression screening: negative. Edinburgh 4.  The following portions of the patient's history were reviewed and updated as appropriate: allergies, current medications, past family history, past medical history, past social history, past surgical history and problem list.  Review of Systems Pertinent items are noted in HPI.  Objective:    BP 120/80   Ht 5\' 5"  (1.651 m)   Wt 220 lb (99.8 kg)   LMP  (LMP Unknown)   BMI 36.61 kg/m   General:  alert and no distress   Breasts:  inspection negative, no nipple discharge or bleeding, no masses or nodularity palpable  Lungs: clear to auscultation bilaterally  Heart:  regular rate and rhythm, S1, S2 normal, no murmur, click, rub or gallop  Abdomen: soft, non-tender; bowel sounds normal; no masses,  no organomegaly.  Well healed Pfannenstiel incision   Vulva:  normal  Vagina: normal vagina, no discharge, exudate, lesion, or erythema  Cervix:  no cervical motion tenderness and no lesions  Corpus: normal size, contour, position, consistency, mobility, non-tender  Adnexa:  normal adnexa and no mass, fullness, tenderness  Rectal Exam: Not performed.          Assessment:  Post Partum Care visit 1. Postpartum care following cesarean delivery   Plan:  See orders and Patient Instructions Follow up in: 7 weeks (DEPO) or as needed.  PAP due in  SUmmer 2022  10-13-1979, MD, Annamarie Major Ob/Gyn, [facility] 01/22/2020  2:42 PM

## 2020-01-23 ENCOUNTER — Other Ambulatory Visit: Payer: Self-pay | Admitting: Obstetrics & Gynecology

## 2020-01-23 MED ORDER — IBUPROFEN 800 MG PO TABS: 800.0000 mg | ORAL_TABLET | Freq: Four times a day (QID) | ORAL | 6 refills | Status: DC | PRN

## 2020-01-24 LAB — GC/CHLAMYDIA PROBE AMP (~~LOC~~) NOT AT ARMC
Chlamydia: NEGATIVE
Comment: NEGATIVE
Comment: NORMAL
Neisseria Gonorrhea: NEGATIVE

## 2020-01-31 ENCOUNTER — Ambulatory Visit: Payer: Medicaid Other | Admitting: Obstetrics & Gynecology

## 2020-02-05 ENCOUNTER — Other Ambulatory Visit: Payer: Self-pay

## 2020-02-05 ENCOUNTER — Encounter: Payer: Self-pay | Admitting: Emergency Medicine

## 2020-02-05 ENCOUNTER — Emergency Department
Admission: EM | Admit: 2020-02-05 | Discharge: 2020-02-05 | Disposition: A | Discharge status: Home / Self Care | Payer: Medicaid Other | Attending: Emergency Medicine | Admitting: Emergency Medicine

## 2020-02-05 DIAGNOSIS — Z20822 Contact with and (suspected) exposure to covid-19: Secondary | ICD-10-CM

## 2020-02-05 DIAGNOSIS — R059 Cough, unspecified: Secondary | ICD-10-CM | POA: Diagnosis present

## 2020-02-05 LAB — RESP PANEL BY RT-PCR (FLU A&B, COVID) ARPGX2
Influenza A by PCR: NEGATIVE
Influenza B by PCR: NEGATIVE
SARS Coronavirus 2 by RT PCR: NEGATIVE

## 2020-02-05 NOTE — ED Triage Notes (Signed)
Pt to ed with c/o cough. Pt states she was exposed to COVID + person on Saturday and is concerned she may have COVID. No other symptoms mentioned. Pt in NAD at triage. Breathing with ease.

## 2020-02-05 NOTE — ED Provider Notes (Signed)
[facility] Emergency Department Provider Note  ____________________________________________  Time seen: Approximately 2:14 PM  I have reviewed the triage vital signs and the nursing notes.   HISTORY  Chief Complaint Cough    HPI Maria Raymond is a 24 y.o. female that presents to the emergency department for evaluation of nasal congestion and occasional dry cough for 2 days.  Patient works at [employer] and has several coworkers with COVID-19.  Patient is requesting a Covid test.  No known fevers.  Patient has not had a Covid vaccine.   No shortness of breath, chest pain, vomiting, abdominal pain, diarrhea.   Past Medical History:  Diagnosis Date  . Depression   . Genital herpes     Patient Active Problem List   Diagnosis Date Noted  . S/P cesarean section 12/22/2019  . Postpartum care following cesarean delivery 12/18/2019  . Single liveborn infant, delivered by cesarean 12/18/2019  . Encounter for care or examination of lactating mother 12/18/2019  . [redacted] weeks gestation of pregnancy 12/16/2019  . Arrest of descent, delivered, current hospitalization 12/16/2019  . Genital herpes   . Renal pelviectasis 08/20/2019  . Encounter for supervision of normal first pregnancy in third trimester 06/23/2019  . Obesity complicating pregnancy, third trimester 06/23/2019    Past Surgical History:  Procedure Laterality Date  . CESAREAN SECTION  12/16/2019   Procedure: CESAREAN SECTION;  Surgeon: Nadara Mustard, MD;  Location: [facility] ORS;  Service: Obstetrics;;  . TONSILLECTOMY      Prior to Admission medications   Medication Sig Start Date End Date Taking? Authorizing Provider  hydrochlorothiazide (HYDRODIURIL) 12.5 MG tablet Take 1 tablet (12.5 mg total) by mouth daily as needed. Patient not taking: Reported on 01/22/2020 12/22/19   Nadara Mustard, MD  ibuprofen (ADVIL) 800 MG tablet Take 1 tablet (800 mg total) by mouth every 6 (six) hours as needed for  cramping. 01/23/20   Nadara Mustard, MD  medroxyPROGESTERone (DEPO-PROVERA) 150 MG/ML injection Inject 1 mL (150 mg total) into the muscle every 3 (three) months. 01/22/20 04/21/20  Nadara Mustard, MD  Prenat w/o A Vit-FeFum-FePo-FA (FOLIVANE-OB) 130-92.4-1 MG CAPS Take 1 capsule by mouth daily. Patient not taking: No sig reported 11/16/19   [provider]  valACYclovir (VALTREX) 1000 MG tablet Take 0.5 tablets (500 mg total) by mouth 2 (two) times daily. 11/24/19   Tresea Mall, CNM    Allergies Patient has no known allergies.  No family history on file.  Social History Social History   Tobacco Use  . Smoking status: Current Every Day Smoker    Packs/day: 0.25    Types: Cigarettes  . Smokeless tobacco: Never Used  Substance Use Topics  . Alcohol use: Yes  . Drug use: Never     Review of Systems  Constitutional: No fever/chills Eyes: No visual changes. No discharge. ENT: Negative for congestion and rhinorrhea. Cardiovascular: No chest pain. Respiratory: Positive for cough. No SOB. Gastrointestinal: No abdominal pain.  No vomiting.  No diarrhea.  No constipation. Musculoskeletal: Negative for musculoskeletal pain. Skin: Negative for rash, abrasions, lacerations, ecchymosis. Neurological: Positive for headache.   ____________________________________________   PHYSICAL EXAM:  VITAL SIGNS: ED Triage Vitals  Enc Vitals Group     BP 02/05/20 1144 126/82     Pulse Rate 02/05/20 1144 73     Resp 02/05/20 1144 18     Temp 02/05/20 1144 98.2 F (36.8 C)     Temp Source 02/05/20 1144 Oral  SpO2 02/05/20 1144 99 %     Weight 02/05/20 1145 215 lb (97.5 kg)     Height 02/05/20 1145 5\' 5"  (1.651 m)     Head Circumference --      Peak Flow --      Pain Score 02/05/20 1144 0     Pain Loc --      Pain Edu? --      Excl. in GC? --      Constitutional: Alert and oriented. Well appearing and in no acute distress. Eyes: Conjunctivae are normal. PERRL. EOMI.  No discharge. Head: Atraumatic. ENT: No frontal and maxillary sinus tenderness.      Ears: Tympanic membranes pearly gray with good landmarks. No discharge.      Nose: No congestion/rhinnorhea.      Mouth/Throat: Mucous membranes are moist. Oropharynx non-erythematous. Tonsils not enlarged. No exudates. Uvula midline. Neck: No stridor.   Hematological/Lymphatic/Immunilogical: No cervical lymphadenopathy. Cardiovascular: Normal rate, regular rhythm.  Good peripheral circulation. Respiratory: Normal respiratory effort without tachypnea or retractions. Lungs CTAB. Good air entry to the bases with no decreased or absent breath sounds. Gastrointestinal: Bowel sounds 4 quadrants. Soft and nontender to palpation. No guarding or rigidity. No palpable masses. No distention. Musculoskeletal: Full range of motion to all extremities. No gross deformities appreciated. Neurologic:  Normal speech and language. No gross focal neurologic deficits are appreciated.  Skin:  Skin is warm, dry and intact. No rash noted. Psychiatric: Mood and affect are normal. Speech and behavior are normal. Patient exhibits appropriate insight and judgement.   ____________________________________________   LABS (all labs ordered are listed, but only abnormal results are displayed)  Labs Reviewed  RESP PANEL BY RT-PCR (FLU A&B, COVID) [id number]   ____________________________________________  EKG   ____________________________________________  RADIOLOGY   No results found.  ____________________________________________    PROCEDURES  Procedure(s) performed:    Procedures    Medications - No data to display   ____________________________________________   INITIAL IMPRESSION / ASSESSMENT AND PLAN / ED COURSE  Pertinent labs & imaging results that were available during my care of the patient were reviewed by me and considered in my medical decision making (see chart for details).  Review of the [place] CSRS  was performed in accordance of the [place] prior to dispensing any controlled drugs.   Patient's diagnosis is consistent with covid exposure and expected covid 19 virus. Vital signs and exam are reassuring.  Covid and influenza tests are pending.  Patient is requesting discharge prior to results.  Patient appears well and is staying well hydrated.  Patient feels comfortable going home. Patient is to follow up with primary care as needed or otherwise directed. Patient is given ED precautions to return to the ED for any worsening or new symptoms.  Maria Raymond was evaluated in Emergency Department on 02/05/2020 for the symptoms described in the history of present illness. She was evaluated in the context of the global COVID-19 pandemic, which necessitated consideration that the patient might be at risk for infection with the SARS-CoV-2 virus that causes COVID-19. Institutional protocols and algorithms that pertain to the evaluation of patients at risk for COVID-19 are in a state of rapid change based on information released by regulatory bodies including the CDC and federal and state organizations. These policies and algorithms were followed during the patient's care in the ED.   ____________________________________________  FINAL CLINICAL IMPRESSION(S) / ED DIAGNOSES  Final diagnoses:  Exposure to COVID-19 virus  Suspected COVID-19 virus infection  NEW MEDICATIONS STARTED DURING THIS VISIT:  ED Discharge Orders    None          This chart was dictated using voice recognition software/Dragon. Despite best efforts to proofread, errors can occur which can change the meaning. Any change was purely unintentional.    Enid Derry, PA-C 02/05/20 1453    Gilles Chiquito, MD 02/05/20 2152

## 2020-02-05 NOTE — ED Notes (Signed)
Pt d/c without signing, pt verbalized no further questions, did not leave with paperwork.

## 2020-02-05 NOTE — ED Notes (Signed)
See triage note- pt here with cough and congestion after positive covid exposure.

## 2020-02-14 NOTE — Telephone Encounter (Signed)
Sch GYN [place] and appt for f/u bleeding post partum on Depo

## 2020-02-21 ENCOUNTER — Encounter: Payer: Self-pay | Admitting: Obstetrics & Gynecology

## 2020-02-21 ENCOUNTER — Ambulatory Visit (INDEPENDENT_AMBULATORY_CARE_PROVIDER_SITE_OTHER): Payer: Medicaid Other | Admitting: Obstetrics & Gynecology

## 2020-02-21 ENCOUNTER — Ambulatory Visit (INDEPENDENT_AMBULATORY_CARE_PROVIDER_SITE_OTHER): Payer: Medicaid Other

## 2020-02-21 ENCOUNTER — Other Ambulatory Visit: Payer: Self-pay

## 2020-02-21 ENCOUNTER — Other Ambulatory Visit: Payer: Self-pay | Admitting: Obstetrics & Gynecology

## 2020-02-21 VITALS — BP 138/60 | Ht 65.0 in | Wt 224.0 lb

## 2020-02-21 DIAGNOSIS — N939 Abnormal uterine and vaginal bleeding, unspecified: Secondary | ICD-10-CM

## 2020-02-21 DIAGNOSIS — N921 Excessive and frequent menstruation with irregular cycle: Secondary | ICD-10-CM | POA: Diagnosis not present

## 2020-02-21 MED ORDER — IBUPROFEN 800 MG PO TABS: 800.0000 mg | ORAL_TABLET | Freq: Four times a day (QID) | ORAL | 6 refills | Status: DC | PRN

## 2020-02-21 MED ORDER — MEDROXYPROGESTERONE ACETATE 10 MG PO TABS: 20.0000 mg | ORAL_TABLET | Freq: Every day | ORAL | 2 refills | Status: DC

## 2020-02-21 NOTE — Progress Notes (Signed)
  HPI: Pt has continued to bleed since delivery and postpartum Depo injection.  [place] today to assess.  Ultrasound demonstrates no masses seen These findings are Pelvis normal  PMHx: She  has a past medical history of Depression and Genital herpes. Also,  has a past surgical history that includes Tonsillectomy and Cesarean section (12/16/2019)., family history is not on file.,  reports that she has been smoking cigarettes. She has been smoking about 0.25 packs per day. She has never used smokeless tobacco. She reports current alcohol use. She reports that she does not use drugs.  She has a current medication list which includes the following prescription(s): medroxyprogesterone, medroxyprogesterone, valacyclovir, and ibuprofen. Also, has No Known Allergies.  Review of Systems  All other systems reviewed and are negative.   Objective: BP 138/60   Ht 5\' 5"  (1.651 m)   Wt 224 lb (101.6 kg)   Breastfeeding No   BMI 37.28 kg/m   Physical examination Constitutional NAD, Conversant  Skin No rashes, lesions or ulceration.   Extremities: Moves all appropriately.  Normal ROM for age. No lymphadenopathy.  Neuro: Grossly intact  Psych: Oriented to PPT.  Normal mood. Normal affect.   PELVIS TRANSVAGINAL NON-OB (TV ONLY)  Result Date: 02/21/2020 Patient Name: Maria Raymond DOB: [birth year] MRN: [birth year] ULTRASOUND REPORT Location: [facility] Date of Service: 02/21/2020 Indications:Abnormal Uterine Bleeding on Depo Findings: The uterus is retroverted and measures 6.9 x 5.4 x 4.6 cm. Echo texture is homogenous without evidence of focal masses. The Endometrium measures 4.3 mm. Right Ovary measures 3.7 x 2.2 x 1.8 cm. It is normal in appearance. Left Ovary measures 4.0 x 1.9 x 1.6 cm. It is normal in appearance. Survey of the adnexa demonstrates no adnexal masses. There is no free fluid in the cul de sac. Impression: 1. Normal pelvic ultrasound. Recommendations: 1.Clinical correlation with the  patient's History and Physical Exam. 04/20/2020, RT Review of ULTRASOUND.    I have personally reviewed images and report of recent ultrasound done at [facility].    Plan of management to be discussed with patient. [facility], MD, Annamarie Major Ob/Gyn, [facility] 02/21/2020  4:39 PM   Assessment:  Menometrorrhagia Hormonal AUB,so will over lap w Provera for 10 days then reassess.  Depo next shot planned 03/11/20.  Alternatives to hormonal birth control discussed.  A total of 20 minutes were spent face-to-face with the patient as well as preparation, review, communication, and documentation during this encounter.   03/13/20, MD, Annamarie Major Ob/Gyn, [facility] 02/21/2020  4:45 PM

## 2020-03-11 ENCOUNTER — Other Ambulatory Visit: Payer: Self-pay | Admitting: Obstetrics & Gynecology

## 2020-03-11 ENCOUNTER — Ambulatory Visit (INDEPENDENT_AMBULATORY_CARE_PROVIDER_SITE_OTHER): Payer: Medicaid Other

## 2020-03-11 ENCOUNTER — Other Ambulatory Visit: Payer: Self-pay

## 2020-03-11 DIAGNOSIS — Z3042 Encounter for surveillance of injectable contraceptive: Secondary | ICD-10-CM

## 2020-03-11 MED ORDER — MEDROXYPROGESTERONE ACETATE 150 MG/ML IM SUSP
150.0000 mg | Freq: Once | INTRAMUSCULAR | Status: AC
  Administered 2020-03-11: 150 mg via INTRAMUSCULAR

## 2020-03-11 MED ORDER — NORGESTIMATE-ETH ESTRADIOL 0.25-35 MG-MCG PO TABS
ORAL_TABLET | ORAL | 0 refills | Status: DC
Start: 2020-03-11 — End: 2020-06-28

## 2020-03-11 NOTE — Progress Notes (Signed)
Pt here for depo which was given IM right glut.  NDC# 9191-6606-00. Unable to document exp date and Lot # as it was pulled off by stickey label. Pt c/o still bleeding heavily; two pads at a time changing about every hour; had u/s; wants to know if there is anything PH can rx to stop the bleeding.  Left detailed msg for pt that medroxyprogesterone 10mg  tablets was eRx'd 02/21/20.

## 2020-04-08 ENCOUNTER — Other Ambulatory Visit: Payer: Self-pay | Admitting: Obstetrics & Gynecology

## 2020-04-08 MED ORDER — MELOXICAM 7.5 MG PO TABS: 7.5000 mg | ORAL_TABLET | Freq: Every day | ORAL | 0 refills | Status: DC

## 2020-05-31 ENCOUNTER — Ambulatory Visit (INDEPENDENT_AMBULATORY_CARE_PROVIDER_SITE_OTHER): Payer: Medicaid Other

## 2020-05-31 ENCOUNTER — Other Ambulatory Visit: Payer: Self-pay

## 2020-05-31 VITALS — BP 104/70

## 2020-05-31 DIAGNOSIS — Z3042 Encounter for surveillance of injectable contraceptive: Secondary | ICD-10-CM

## 2020-05-31 MED ORDER — MEDROXYPROGESTERONE ACETATE 150 MG/ML IM SUSP
150.0000 mg | Freq: Once | INTRAMUSCULAR | Status: AC
  Administered 2020-05-31: 150 mg via INTRAMUSCULAR

## 2020-05-31 NOTE — Progress Notes (Signed)
Pt here for depo inj which was given IM right deltoid. NDC# 1448-1856-31.  Pt c/o swelling in her feet; has send mychart msg c pictures; 75m pp; states she only had trouble c BP during delivery; next day it was fine; no BP meds given; BP after inj was 104/70.  Adv pt I'd be sure PH got the msg.

## 2020-06-28 ENCOUNTER — Other Ambulatory Visit: Payer: Self-pay | Admitting: Obstetrics & Gynecology

## 2020-06-28 MED ORDER — NORGESTIMATE-ETH ESTRADIOL 0.25-35 MG-MCG PO TABS: ORAL_TABLET | ORAL | 0 refills | Status: DC

## 2020-06-28 NOTE — Telephone Encounter (Signed)
Please advise 

## 2020-07-22 ENCOUNTER — Ambulatory Visit: Payer: Medicaid Other | Admitting: Obstetrics & Gynecology

## 2020-07-23 ENCOUNTER — Ambulatory Visit: Payer: Medicaid Other | Admitting: Obstetrics & Gynecology

## 2020-08-23 ENCOUNTER — Other Ambulatory Visit: Payer: Self-pay

## 2020-08-23 ENCOUNTER — Ambulatory Visit (INDEPENDENT_AMBULATORY_CARE_PROVIDER_SITE_OTHER): Payer: Medicaid Other

## 2020-08-23 DIAGNOSIS — Z3042 Encounter for surveillance of injectable contraceptive: Secondary | ICD-10-CM | POA: Diagnosis not present

## 2020-08-23 MED ORDER — MEDROXYPROGESTERONE ACETATE 150 MG/ML IM SUSP: 150.0000 mg | INTRAMUSCULAR | 3 refills | Status: DC

## 2020-08-23 MED ORDER — MEDROXYPROGESTERONE ACETATE 150 MG/ML IM SUSP
150.0000 mg | Freq: Once | INTRAMUSCULAR | Status: AC
  Administered 2020-08-23: 150 mg via INTRAMUSCULAR

## 2020-08-23 NOTE — Progress Notes (Signed)
Pt here for depo which was given IM right glut.  NDC# 66993-370-83 

## 2020-08-23 NOTE — Telephone Encounter (Signed)
Pt calling; wants a call back.  [phone]  Left msg I will send in refill; the pharm sent request.

## 2020-09-26 IMAGING — CR DG HAND COMPLETE 3+V*L*
1 series · 3 of 3 positions shown · non-contrast
Comparison: None.

CLINICAL DATA: Initial evaluation for acute trauma, fall.

EXAM:
LEFT HAND - COMPLETE 3+ VIEW

[Series 1: x hand pa left · 0.14mm/px · 3 of 3 slices shown]
[im 1/3]
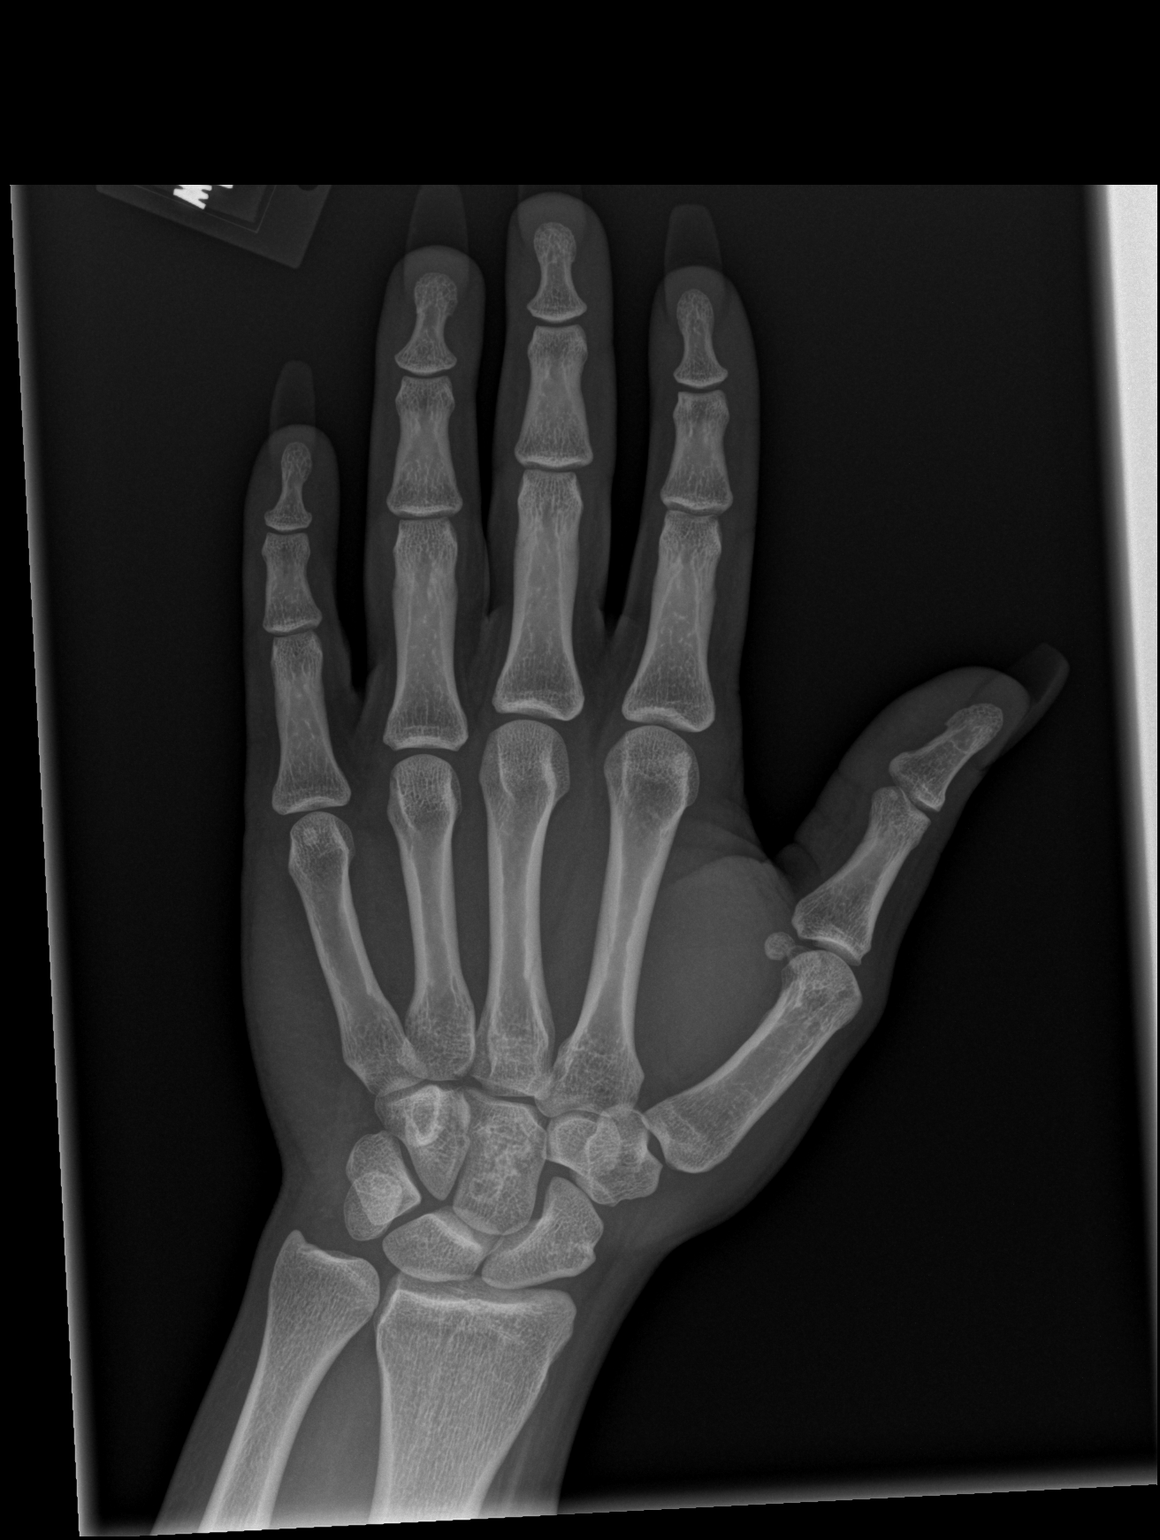
[im 2/3]
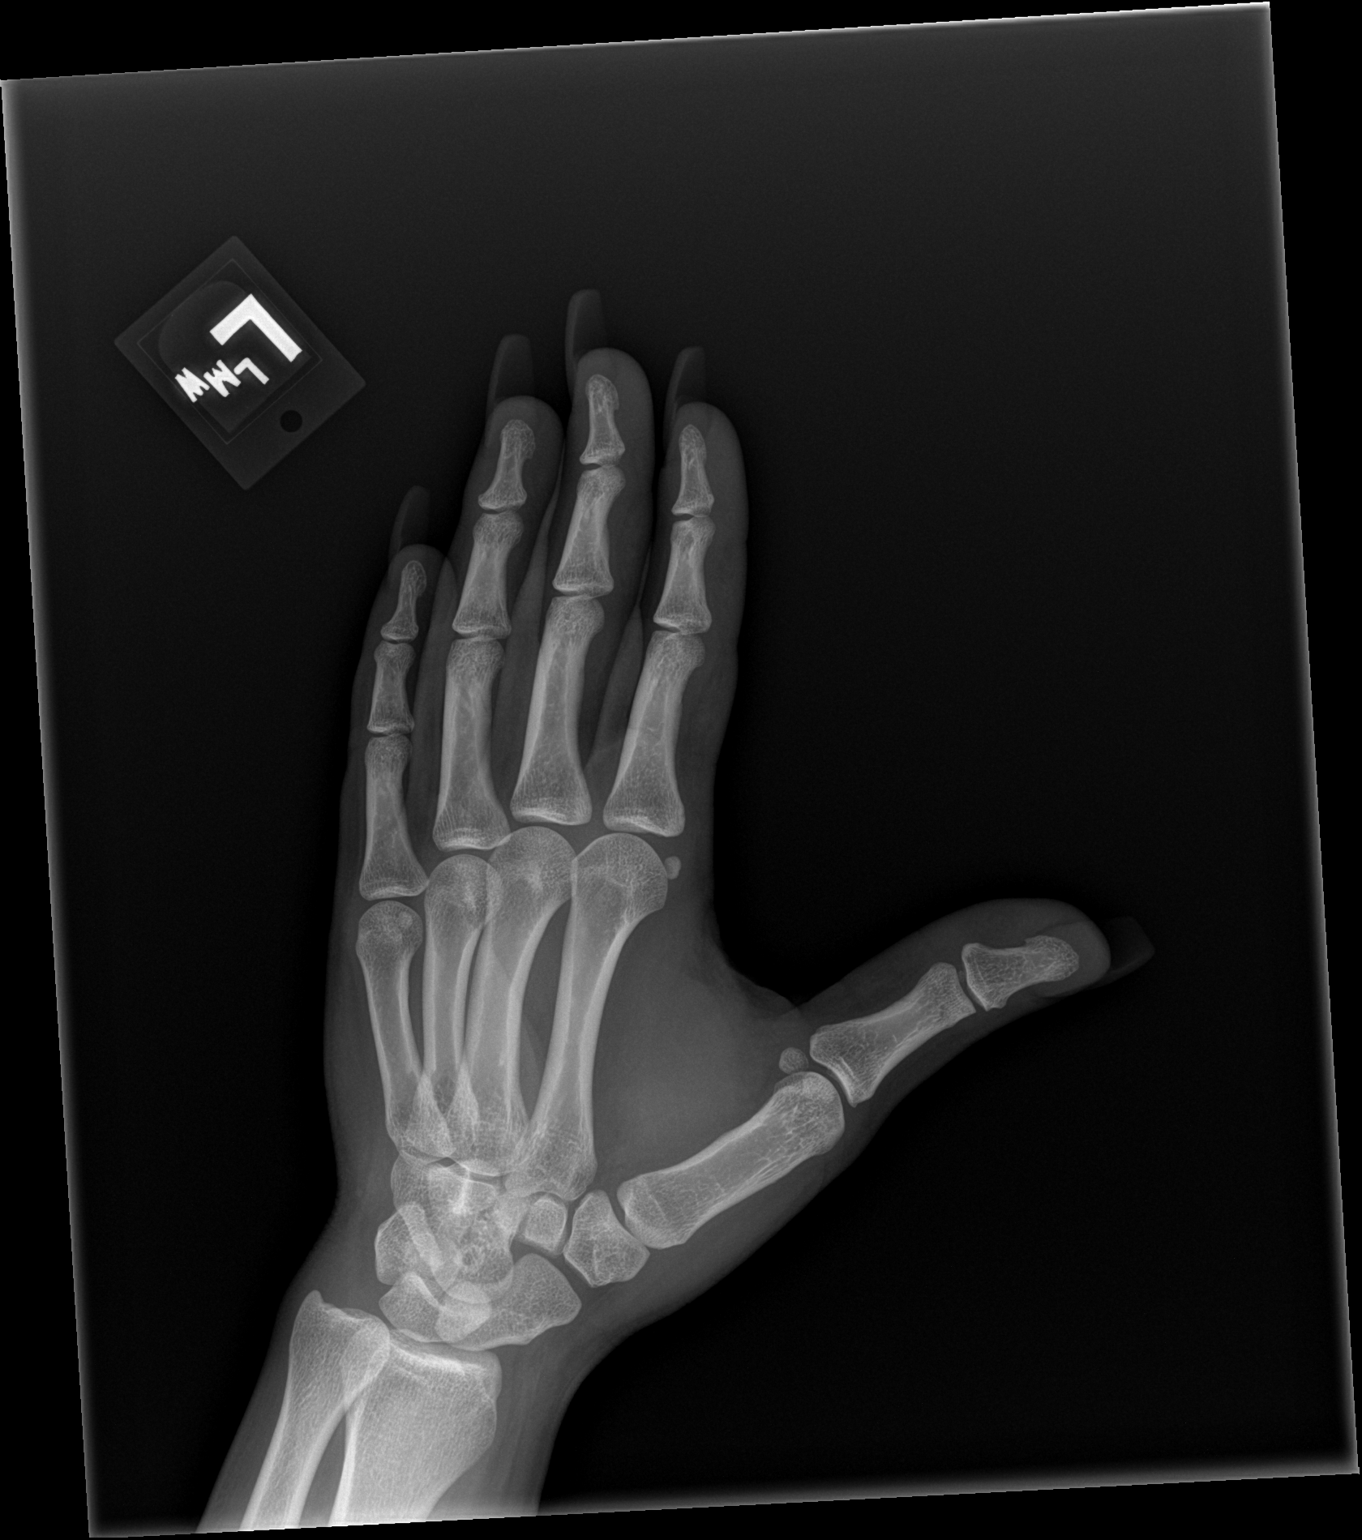
[im 3/3]
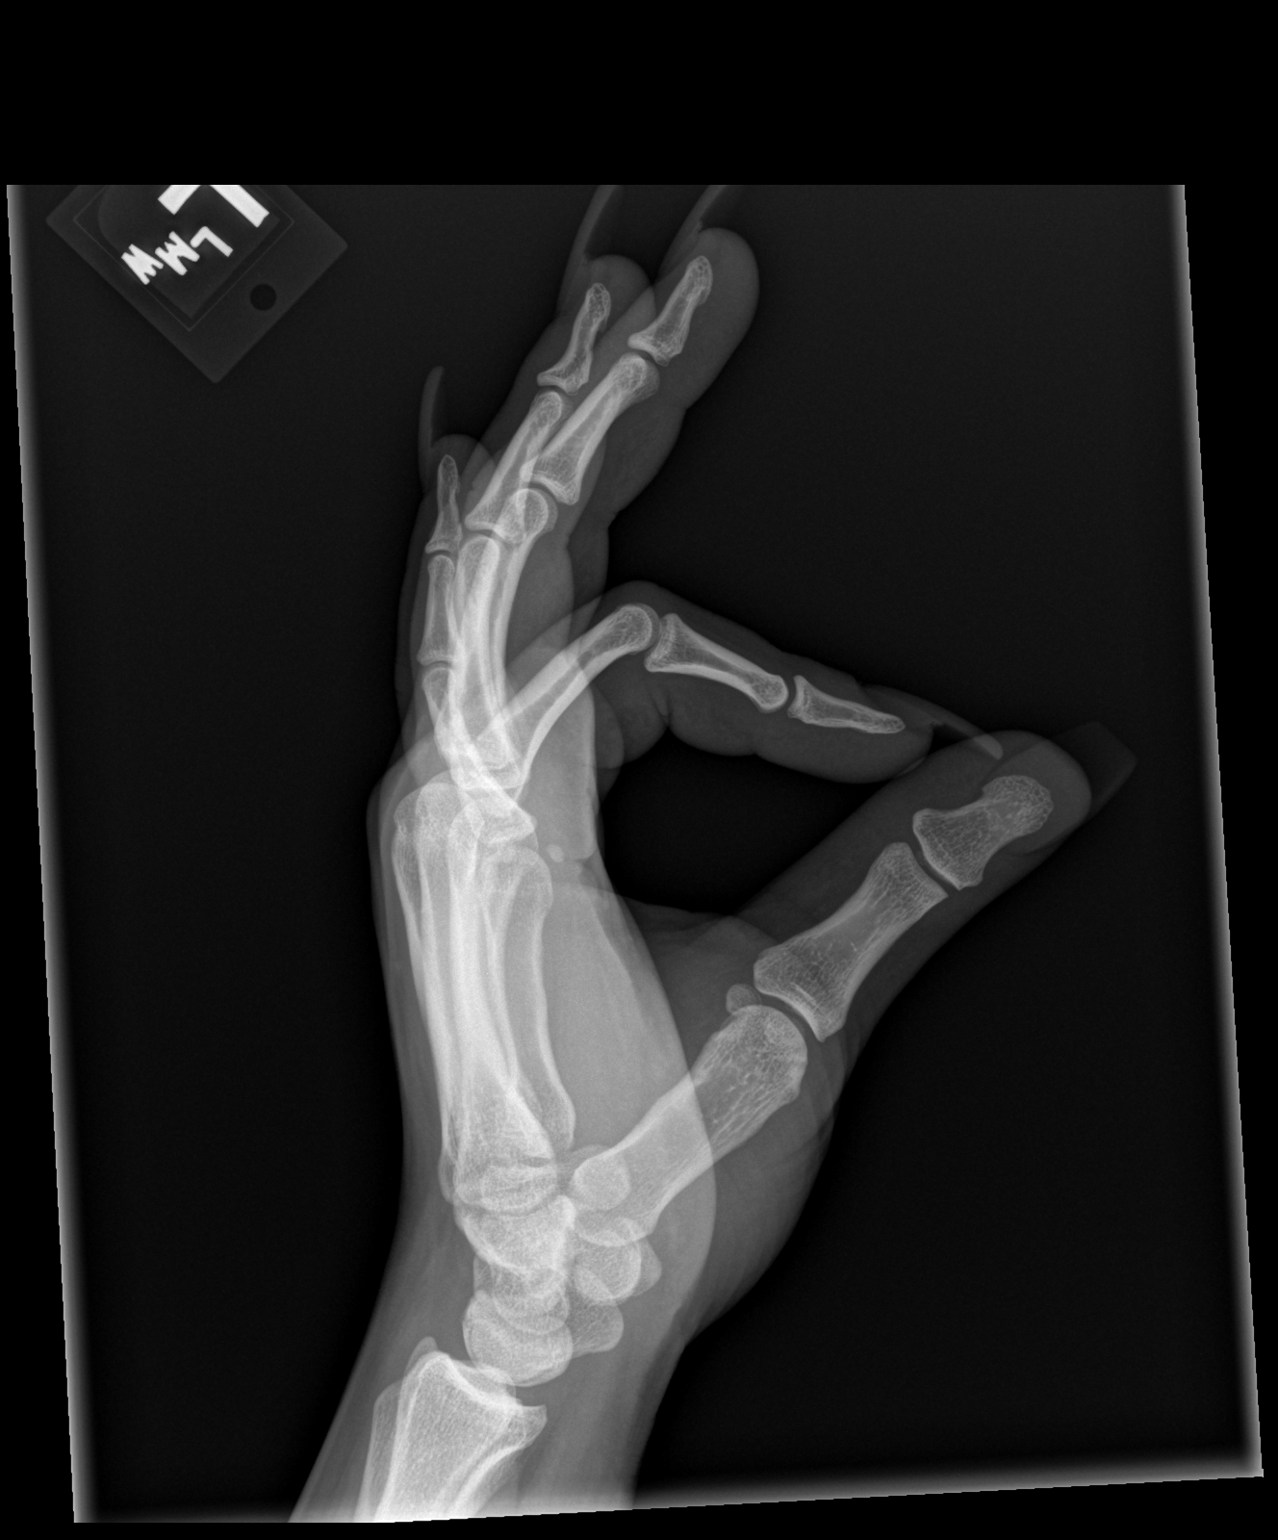

[3 of 3 positions shown; findings below may reference images not displayed]

FINDINGS: There is no evidence of fracture or dislocation. There is no
evidence of arthropathy or other focal bone abnormality. Soft
tissues are unremarkable.
IMPRESSION: Negative.

## 2020-11-15 ENCOUNTER — Other Ambulatory Visit: Payer: Self-pay

## 2020-11-15 ENCOUNTER — Ambulatory Visit (INDEPENDENT_AMBULATORY_CARE_PROVIDER_SITE_OTHER): Payer: Medicaid Other

## 2020-11-15 DIAGNOSIS — Z3042 Encounter for surveillance of injectable contraceptive: Secondary | ICD-10-CM | POA: Diagnosis not present

## 2020-11-15 MED ORDER — MEDROXYPROGESTERONE ACETATE 150 MG/ML IM SUSP
150.0000 mg | Freq: Once | INTRAMUSCULAR | Status: AC
Start: 2020-11-15 — End: 2020-11-15
  Administered 2020-11-15: 150 mg via INTRAMUSCULAR

## 2021-02-07 ENCOUNTER — Ambulatory Visit (INDEPENDENT_AMBULATORY_CARE_PROVIDER_SITE_OTHER): Payer: Medicaid Other

## 2021-02-07 ENCOUNTER — Other Ambulatory Visit: Payer: Self-pay

## 2021-02-07 DIAGNOSIS — Z3042 Encounter for surveillance of injectable contraceptive: Secondary | ICD-10-CM

## 2021-02-07 MED ORDER — MEDROXYPROGESTERONE ACETATE 150 MG/ML IM SUSP
150.0000 mg | Freq: Once | INTRAMUSCULAR | Status: AC
  Administered 2021-02-07: 15:00:00 150 mg via INTRAMUSCULAR

## 2021-02-07 NOTE — Progress Notes (Signed)
Pt here for depo which was given IM right deltoid.  Pt tolerated well.  Pt stated she is very discouraged b/c she lost her job today after 81yrs.  NDC# [phone]

## 2021-04-30 ENCOUNTER — Ambulatory Visit: Payer: 59

## 2021-05-09 ENCOUNTER — Ambulatory Visit: Payer: 59

## 2021-05-13 ENCOUNTER — Ambulatory Visit (INDEPENDENT_AMBULATORY_CARE_PROVIDER_SITE_OTHER): Payer: Medicaid Other

## 2021-05-13 DIAGNOSIS — Z3042 Encounter for surveillance of injectable contraceptive: Secondary | ICD-10-CM

## 2021-05-13 MED ORDER — MEDROXYPROGESTERONE ACETATE 150 MG/ML IM SUSP
150.0000 mg | Freq: Once | INTRAMUSCULAR | Status: AC
  Administered 2021-05-13: 150 mg via INTRAMUSCULAR

## 2021-06-08 ENCOUNTER — Other Ambulatory Visit: Payer: Self-pay

## 2021-06-08 ENCOUNTER — Encounter: Payer: Self-pay | Admitting: Emergency Medicine

## 2021-06-08 ENCOUNTER — Emergency Department
Admission: EM | Admit: 2021-06-08 | Discharge: 2021-06-08 | Disposition: A | Discharge status: Home / Self Care | Payer: Medicaid Other | Attending: Emergency Medicine | Admitting: Emergency Medicine

## 2021-06-08 DIAGNOSIS — T310 Burns involving less than 10% of body surface: Secondary | ICD-10-CM | POA: Diagnosis not present

## 2021-06-08 DIAGNOSIS — T2113XA Burn of first degree of upper back, initial encounter: Secondary | ICD-10-CM | POA: Insufficient documentation

## 2021-06-08 DIAGNOSIS — T2120XA Burn of second degree of trunk, unspecified site, initial encounter: Secondary | ICD-10-CM | POA: Insufficient documentation

## 2021-06-08 DIAGNOSIS — T2103XA Burn of unspecified degree of upper back, initial encounter: Secondary | ICD-10-CM | POA: Diagnosis present

## 2021-06-08 DIAGNOSIS — T300 Burn of unspecified body region, unspecified degree: Secondary | ICD-10-CM

## 2021-06-08 DIAGNOSIS — X158XXA Contact with other hot household appliances, initial encounter: Secondary | ICD-10-CM | POA: Diagnosis not present

## 2021-06-08 DIAGNOSIS — T22111A Burn of first degree of right forearm, initial encounter: Secondary | ICD-10-CM | POA: Diagnosis not present

## 2021-06-08 DIAGNOSIS — Y9289 Other specified places as the place of occurrence of the external cause: Secondary | ICD-10-CM | POA: Insufficient documentation

## 2021-06-08 MED ORDER — OXYCODONE-ACETAMINOPHEN 5-325 MG PO TABS: 1.0000 | ORAL_TABLET | ORAL | 0 refills | Status: DC | PRN

## 2021-06-08 MED ORDER — SILVER SULFADIAZINE 1 % EX CREA
TOPICAL_CREAM | Freq: Once | CUTANEOUS | Status: AC
  Filled 2021-06-08: qty 85

## 2021-06-08 MED ORDER — SODIUM CHLORIDE 0.9 % IV BOLUS
1000.0000 mL | Freq: Once | INTRAVENOUS | Status: AC
  Administered 2021-06-08: 1000 mL via INTRAVENOUS

## 2021-06-08 MED ORDER — KETOROLAC TROMETHAMINE 30 MG/ML IJ SOLN
10.0000 mg | Freq: Once | INTRAMUSCULAR | Status: AC
  Administered 2021-06-08: 9.9 mg via INTRAVENOUS
  Filled 2021-06-08: qty 1

## 2021-06-08 MED ORDER — SILVER SULFADIAZINE 1 % EX CREA: TOPICAL_CREAM | CUTANEOUS | 1 refills | Status: DC

## 2021-06-08 MED ORDER — IBUPROFEN 800 MG PO TABS: 800.0000 mg | ORAL_TABLET | Freq: Three times a day (TID) | ORAL | 0 refills | Status: DC | PRN

## 2021-06-08 MED ORDER — ONDANSETRON HCL 4 MG/2ML IJ SOLN
4.0000 mg | Freq: Once | INTRAMUSCULAR | Status: AC
  Administered 2021-06-08: 4 mg via INTRAVENOUS
  Filled 2021-06-08: qty 2

## 2021-06-08 MED ORDER — MORPHINE SULFATE (PF) 4 MG/ML IV SOLN
4.0000 mg | Freq: Once | INTRAVENOUS | Status: AC
  Administered 2021-06-08: 4 mg via INTRAVENOUS
  Filled 2021-06-08: qty 1
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
## 2021-08-05 ENCOUNTER — Ambulatory Visit: Payer: Medicaid Other

## 2021-08-05 ENCOUNTER — Telehealth: Payer: Self-pay | Admitting: Licensed Practical Nurse

## 2021-08-05 NOTE — Telephone Encounter (Signed)
Contacted pt to reschedule 6/27 for Depo injection.  Left message for pt to call to reschedule.

## 2021-08-06 NOTE — Telephone Encounter (Signed)
Pt called in and rescheduled Depo injection.  Appt rescheduled for 6/30 at 11:00.  Did not have to send  a Medical laboratory scientific officer.

## 2021-08-06 NOTE — Telephone Encounter (Signed)
Contacted pt to reschedule 6/27 for Depo injection.  Left message for pt to call to reschedule.  Will send a MyChart letter.

## 2021-08-08 ENCOUNTER — Telehealth: Payer: Self-pay | Admitting: Advanced Practice Midwife

## 2021-08-08 ENCOUNTER — Ambulatory Visit: Payer: Medicaid Other

## 2021-08-08 NOTE — Telephone Encounter (Signed)
Contacted pt to reschedule Depo injection that was scheduled for 6/30 at 11:00.  Left message for pt to call back to reschedule.

## 2021-08-11 ENCOUNTER — Ambulatory Visit (INDEPENDENT_AMBULATORY_CARE_PROVIDER_SITE_OTHER): Payer: Medicaid Other

## 2021-08-11 DIAGNOSIS — Z3042 Encounter for surveillance of injectable contraceptive: Secondary | ICD-10-CM | POA: Diagnosis not present

## 2021-08-11 MED ORDER — MEDROXYPROGESTERONE ACETATE 150 MG/ML IM SUSP
150.0000 mg | Freq: Once | INTRAMUSCULAR | Status: AC
  Administered 2021-08-11: 150 mg via INTRAMUSCULAR

## 2021-08-11 NOTE — Progress Notes (Signed)
Date last pap: 06/23/2019. Last Depo-Provera: 05/13/2021. Side Effects if any: None. Serum HCG indicated? N/A. Depo-Provera 150 mg IM given by: Doristine Devoid, CMA. Next appointment due 10/27/21 Thru 11/10/21.   Patient received Depo injection today, tolerated it well. Received in Left Deltoid.

## 2021-08-11 NOTE — Telephone Encounter (Signed)
Patient rescheduled for 08/11/21 at 2:35 pm

## 2021-11-03 ENCOUNTER — Ambulatory Visit: Payer: Medicaid Other | Admitting: Obstetrics and Gynecology

## 2021-11-03 ENCOUNTER — Ambulatory Visit: Payer: Medicaid Other

## 2021-11-03 NOTE — Progress Notes (Deleted)
[facility]P:  [facility] [place]   No chief complaint on file.    HPI:      [name] is a 26 y.o. G1P1001 whose LMP was No LMP recorded., presents today for her annual examination.  Her menses are absent due to depo.  Sex activity: {sex active: 315163}.  Last Pap: 06/23/19 Results were: no abnormalities  Hx of STDs: trichomonas  There is no FH of breast cancer. There is no FH of ovarian cancer. The patient {does:18564} do self-breast exams.  Tobacco use: {tob:20664} Alcohol use: {Alcohol:11675} No drug use.  Exercise: {exercise:31265}  She {does:18564} get adequate calcium and Vitamin D in her diet.  Patient Active Problem List   Diagnosis Date Noted   S/P cesarean section 12/22/2019   Postpartum care following cesarean delivery 12/18/2019   Single liveborn infant, delivered by cesarean 12/18/2019   Encounter for care or examination of lactating mother 12/18/2019   [redacted] weeks gestation of pregnancy 12/16/2019   Renal pelviectasis [id number]   Obesity complicating pregnancy, third trimester 06/23/2019    Past Surgical History:  Procedure Laterality Date   CESAREAN SECTION  12/16/2019   Procedure: CESAREAN SECTION;  Surgeon: Gae Dry, MD;  Location: [facility] ORS;  Service: Obstetrics;;   TONSILLECTOMY      No family history on file.  Social History   Socioeconomic History   Marital status: Single    Spouse name: Not on file   Number of children: Not on file   Years of education: Not on file   Highest education level: Not on file  Occupational History   Not on file  Tobacco Use   Smoking status: Every Day    Packs/day: 0.25    Types: Cigarettes   Smokeless tobacco: Never  Substance and Sexual Activity   Alcohol use: Yes   Drug use: Never   Sexual activity: Not Currently    Birth control/protection: Injection  Other Topics Concern   Not on file  Social History Narrative   Not on file   Social Determinants of Health   Financial  Resource Strain: Not on file  Food Insecurity: Not on file  Transportation Needs: Not on file  Physical Activity: Not on file  Stress: Not on file  Social Connections: Not on file  Intimate Partner Violence: Not on file     Current Outpatient Medications:    meloxicam (MOBIC) 7.5 MG tablet, Take 1 tablet (7.5 mg total) by mouth daily., Disp: 15 tablet, Rfl: 0   ibuprofen (ADVIL) 800 MG tablet, Take 1 tablet (800 mg total) by mouth every 8 (eight) hours as needed for moderate pain., Disp: 15 tablet, Rfl: 0   medroxyPROGESTERone (DEPO-PROVERA) 150 MG/ML injection, Inject 1 mL (150 mg total) into the muscle every 3 (three) months., Disp: 1 mL, Rfl: 3   norgestimate-ethinyl estradiol (ORTHO-CYCLEN) 0.25-35 MG-MCG tablet, Take 3 tabs po daily 3 days, then 2 tabs po daily for 3 days, then 1 tab po daily to finish pack., Disp: 28 tablet, Rfl: 0   oxyCODONE-acetaminophen (PERCOCET/ROXICET) 5-325 MG tablet, Take 1 tablet by mouth every 4 (four) hours as needed for severe pain., Disp: 30 tablet, Rfl: 0   silver sulfADIAZINE (SILVADENE) 1 % cream, Apply to affected area daily, Disp: 50 g, Rfl: 1   valACYclovir (VALTREX) 1000 MG tablet, Take 0.5 tablets (500 mg total) by mouth 2 (two) times daily., Disp: 60 tablet, Rfl: 1     ROS:  Review of Systems BREAST: No  symptoms   Objective: There were no vitals taken for this visit.   OBGyn Exam  Results: No results found for this or any previous visit (from the past 24 hour(s)).  Assessment/Plan: No diagnosis found.  No orders of the defined types were placed in this encounter.            GYN counsel {counseling: 16159}     F/U  No follow-ups on file.  Vasily Fedewa B. Vernee Baines, PA-C 11/03/2021 11:13 AM

## 2021-11-04 ENCOUNTER — Encounter: Payer: Self-pay | Admitting: Emergency Medicine

## 2021-11-04 ENCOUNTER — Other Ambulatory Visit: Payer: Self-pay

## 2021-11-04 ENCOUNTER — Emergency Department
Admission: EM | Admit: 2021-11-04 | Discharge: 2021-11-04 | Discharge status: Against Medical Advice | Payer: Medicaid Other | Attending: Emergency Medicine | Admitting: Emergency Medicine

## 2021-11-04 ENCOUNTER — Emergency Department: Payer: Medicaid Other

## 2021-11-04 DIAGNOSIS — R509 Fever, unspecified: Secondary | ICD-10-CM | POA: Diagnosis present

## 2021-11-04 DIAGNOSIS — Z5321 Procedure and treatment not carried out due to patient leaving prior to being seen by health care provider: Secondary | ICD-10-CM | POA: Diagnosis not present

## 2021-11-04 DIAGNOSIS — R059 Cough, unspecified: Secondary | ICD-10-CM | POA: Insufficient documentation

## 2021-11-04 MED ORDER — MEDROXYPROGESTERONE ACETATE 150 MG/ML IM SUSP: 150.0000 mg | INTRAMUSCULAR | 0 refills | Status: DC

## 2021-11-04 NOTE — ED Notes (Signed)
No answer when called several times from lobby 

## 2021-11-04 NOTE — ED Notes (Signed)
Pt asking how to obtain just a work note.  Advised she must see a provider before a work note can be given.

## 2021-11-04 NOTE — ED Notes (Signed)
Pt declined covid swab states she has taked 3 at home test that are all negative. Pt needs work note.

## 2021-11-04 NOTE — ED Triage Notes (Signed)
Fever and cough x 2 days. Denies any pain. Pt states fever was 103 at 4pm today and took mucinex at home.

## 2021-11-04 NOTE — ED Provider Triage Note (Signed)
Emergency Medicine Provider Triage Evaluation Note  Maria Raymond, a 26 y.o. female  was evaluated in triage.  Pt complains of cough x2 days.  Patient denies any chest pain, shortness of breath, vomiting patient does endorse sick contacts at work.  She endorses 3 negative home COVID test patient presents to the ED for further evaluation of her symptoms..  Review of Systems  Positive: Cough, fever Negative: NVD  Physical Exam  BP 129/80 (BP Location: Right Arm)   Pulse 94   Temp 98.6 F (37 C) (Oral)   Resp 18   Ht 5\' 5"  (1.651 m)   Wt 98 kg   LMP 11/04/2021 (Exact Date)   SpO2 98%   BMI 35.95 kg/m  Gen:   Awake, no distress NAD Resp:  Normal effort CTA MSK:   Moves extremities without difficulty  CVS:  RRR  Medical Decision Making  Medically screening exam initiated at 9:38 PM.  Appropriate orders placed.  Maria Raymond was informed that the remainder of the evaluation will be completed by another provider, this initial triage assessment does not replace that evaluation, and the importance of remaining in the ED until their evaluation is complete.  Patient to the ED for evaluation of cough, and fevers x2 days.  Patient reports negative home COVID test.  She declined COVID testing in the ED today.   Melvenia Needles, PA-C 11/04/21 2140

## 2021-11-05 ENCOUNTER — Telehealth: Payer: Self-pay | Admitting: Obstetrics & Gynecology

## 2021-11-05 NOTE — Telephone Encounter (Signed)
Patient is scheduled to see CJE on 11/11/21 at 2:35. Left message for patient to call office to r/s

## 2021-11-11 ENCOUNTER — Other Ambulatory Visit (HOSPITAL_COMMUNITY)
Admission: RE | Admit: 2021-11-11 | Discharge: 2021-11-11 | Disposition: A | Discharge status: Home / Self Care | Payer: Medicaid Other | Source: Ambulatory Visit | Attending: Obstetrics and Gynecology | Admitting: Obstetrics and Gynecology

## 2021-11-11 ENCOUNTER — Encounter: Payer: Self-pay | Admitting: Obstetrics & Gynecology

## 2021-11-11 ENCOUNTER — Ambulatory Visit (INDEPENDENT_AMBULATORY_CARE_PROVIDER_SITE_OTHER): Payer: Medicaid Other | Admitting: Obstetrics & Gynecology

## 2021-11-11 VITALS — BP 114/73 | HR 80 | Resp 16 | Ht 65.0 in | Wt 208.9 lb

## 2021-11-11 DIAGNOSIS — Z01419 Encounter for gynecological examination (general) (routine) without abnormal findings: Secondary | ICD-10-CM | POA: Diagnosis not present

## 2021-11-11 DIAGNOSIS — Z3202 Encounter for pregnancy test, result negative: Secondary | ICD-10-CM | POA: Diagnosis not present

## 2021-11-11 DIAGNOSIS — Z113 Encounter for screening for infections with a predominantly sexual mode of transmission: Secondary | ICD-10-CM | POA: Diagnosis present

## 2021-11-11 DIAGNOSIS — Z3042 Encounter for surveillance of injectable contraceptive: Secondary | ICD-10-CM | POA: Diagnosis not present

## 2021-11-11 DIAGNOSIS — Z124 Encounter for screening for malignant neoplasm of cervix: Secondary | ICD-10-CM

## 2021-11-11 LAB — POCT URINE PREGNANCY: Preg Test, Ur: NEGATIVE

## 2021-11-11 MED ORDER — MEDROXYPROGESTERONE ACETATE 150 MG/ML IM SUSP
150.0000 mg | Freq: Once | INTRAMUSCULAR | Status: AC
  Administered 2021-11-11: 150 mg via INTRAMUSCULAR

## 2021-11-11 NOTE — Patient Instructions (Signed)
Medroxyprogesterone Injection (Contraception) ?What is this medication? ?MEDROXYPROGESTERONE (me DROX ee proe JES te rone) prevents ovulation and pregnancy. It belongs to a group of medications called contraceptives. This medication is a progestin hormone. ?This medicine may be used for other purposes; ask your health care provider or pharmacist if you have questions. ?COMMON BRAND NAME(S): Depo-Provera, Depo-subQ Provera 104 ?What should I tell my care team before I take this medication? ?They need to know if you have any of these conditions: ?Asthma ?Blood clots ?Breast cancer or family history of breast cancer ?Depression ?Diabetes ?Eating disorder (anorexia nervosa) ?Heart attack ?High blood pressure ?HIV infection or AIDS ?If you often drink alcohol ?Kidney disease ?Liver disease ?Migraine headaches ?Osteoporosis, weak bones ?Seizures ?Stroke ?Tobacco smoker ?Vaginal bleeding ?An unusual or allergic reaction to medroxyprogesterone, other hormones, medications, foods, dyes, or preservatives ?Pregnant or trying to get pregnant ?Breast-feeding ?How should I use this medication? ?Depo-Provera CI contraceptive injection is given into a muscle. Depo-subQ Provera 104 injection is given under the skin. It is given in a hospital or clinic setting. The injection is usually given during the first 5 days after the start of a menstrual period or 6 weeks after delivery of a baby. ?A patient package insert for the product will be given with each prescription and refill. Be sure to read this information carefully each time. The sheet may change often. ?Talk to your care team about the use of this medication in children. Special care may be needed. These injections have been used in female children who have started having menstrual periods. ?Overdosage: If you think you have taken too much of this medicine contact a poison control center or emergency room at once. ?NOTE: This medicine is only for you. Do not share this medicine  with others. ?What if I miss a dose? ?Keep appointments for follow-up doses. You must get an injection once every 3 months. It is important not to miss your dose. Call your care team if you are unable to keep an appointment. ?What may interact with this medication? ?Antibiotics or medications for infections, especially rifampin and griseofulvin ?Antivirals for HIV or hepatitis ?Aprepitant ?Armodafinil ?Bexarotene ?Bosentan ?Medications for seizures like carbamazepine, felbamate, oxcarbazepine, phenytoin, phenobarbital, primidone, topiramate ?Mitotane ?Modafinil ?St. John's wort ?This list may not describe all possible interactions. Give your health care provider a list of all the medicines, herbs, non-prescription drugs, or dietary supplements you use. Also tell them if you smoke, drink alcohol, or use illegal drugs. Some items may interact with your medicine. ?What should I watch for while using this medication? ?This medication does not protect you against HIV infection (AIDS) or other sexually transmitted diseases. ?Use of this product may cause you to lose calcium from your bones. Loss of calcium may cause weak bones (osteoporosis). Only use this product for more than 2 years if other forms of birth control are not right for you. The longer you use this product for birth control the more likely you will be at risk for weak bones. Ask your care team how you can keep strong bones. ?You may have a change in bleeding pattern or irregular periods. Many females stop having periods while taking this medication. ?If you have received your injections on time, your chance of being pregnant is very low. If you think you may be pregnant, see your care team as soon as possible. ?Tell your care team if you want to get pregnant within the next year. The effect of this medication may last a   long time after you get your last injection. ?What side effects may I notice from receiving this medication? ?Side effects that you should  report to your care team as soon as possible: ?Allergic reactions--skin rash, itching, hives, swelling of the face, lips, tongue, or throat ?Blood clot--pain, swelling, or warmth in the leg, shortness of breath, chest pain ?Gallbladder problems--severe stomach pain, nausea, vomiting, fever ?Increase in blood pressure ?Liver injury--right upper belly pain, loss of appetite, nausea, light-colored stool, dark yellow or brown urine, yellowing skin or eyes, unusual weakness or fatigue ?New or worsening migraines or headaches ?Seizures ?Stroke--sudden numbness or weakness of the face, arm, or leg, trouble speaking, confusion, trouble walking, loss of balance or coordination, dizziness, severe headache, change in vision ?Unusual vaginal discharge, itching, or odor ?Worsening mood, feelings of depression ?Side effects that usually do not require medical attention (report to your care team if they continue or are bothersome): ?Breast pain or tenderness ?Dark patches of the skin on the face or other sun-exposed areas ?Irregular menstrual cycles or spotting ?Nausea ?Weight gain ?This list may not describe all possible side effects. Call your doctor for medical advice about side effects. You may report side effects to FDA at 1-800-FDA-1088. ?Where should I keep my medication? ?This injection is only given by a care team. It will not be stored at home. ?NOTE: This sheet is a summary. It may not cover all possible information. If you have questions about this medicine, talk to your doctor, pharmacist, or health care provider. ?? 2023 Elsevier/Gold Standard (2020-03-31 00:00:00) ? ?
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
## 2021-11-13 ENCOUNTER — Telehealth: Payer: Self-pay

## 2021-11-13 ENCOUNTER — Other Ambulatory Visit: Payer: Self-pay

## 2021-11-13 DIAGNOSIS — B9689 Other specified bacterial agents as the cause of diseases classified elsewhere: Secondary | ICD-10-CM

## 2021-11-13 LAB — CERVICOVAGINAL ANCILLARY ONLY
Bacterial Vaginitis (gardnerella): POSITIVE — AB
Candida Glabrata: NEGATIVE
Candida Vaginitis: NEGATIVE
Chlamydia: NEGATIVE
Comment: NEGATIVE
Comment: NEGATIVE
Comment: NEGATIVE
Comment: NEGATIVE
Comment: NEGATIVE
Comment: NORMAL
Neisseria Gonorrhea: NEGATIVE
Trichomonas: NEGATIVE

## 2021-11-13 MED ORDER — METRONIDAZOLE 500 MG PO TABS: 500.0000 mg | ORAL_TABLET | Freq: Two times a day (BID) | ORAL | 0 refills | Status: DC

## 2021-11-13 NOTE — Telephone Encounter (Signed)
Pt left msg on triage requesting call back. Called her back and she just wanted to ask if her partner needed treatment for BV since she tested positive for BV. Advised no, all STD was negative so they both don't need STD treatment.

## 2022-02-03 ENCOUNTER — Ambulatory Visit: Payer: Medicaid Other

## 2022-02-03 NOTE — Progress Notes (Deleted)
Date last pap: ***.  Last Depo-Provera: ***.  Side Effects if any: ***.  Serum HCG indicated? ***.  Depo-Provera 150 mg IM given by: ***.  Next appointment due ***.

## 2022-02-18 ENCOUNTER — Ambulatory Visit (INDEPENDENT_AMBULATORY_CARE_PROVIDER_SITE_OTHER): Payer: Medicaid Other

## 2022-02-18 VITALS — BP 120/80 | Ht 65.0 in | Wt 214.0 lb

## 2022-02-18 DIAGNOSIS — Z3042 Encounter for surveillance of injectable contraceptive: Secondary | ICD-10-CM | POA: Diagnosis not present

## 2022-02-18 DIAGNOSIS — Z3202 Encounter for pregnancy test, result negative: Secondary | ICD-10-CM

## 2022-02-18 LAB — POCT URINE PREGNANCY: Preg Test, Ur: NEGATIVE

## 2022-02-18 MED ORDER — MEDROXYPROGESTERONE ACETATE 150 MG/ML IM SUSP
150.0000 mg | Freq: Once | INTRAMUSCULAR | Status: AC
  Administered 2022-02-18: 150 mg via INTRAMUSCULAR

## 2022-02-18 NOTE — Progress Notes (Cosign Needed Addendum)
Date last pap: 06/23/19. Last Depo-Provera: 11/11/21. Side Effects if any: no. Urine pregnancy negative . Depo-Provera 150 mg IM given by: Quintella Baton cma. Next appointment due 05/13/22.

## 2022-04-08 ENCOUNTER — Other Ambulatory Visit: Payer: Self-pay

## 2022-04-08 ENCOUNTER — Emergency Department
Admission: EM | Admit: 2022-04-08 | Discharge: 2022-04-08 | Disposition: A | Discharge status: Home / Self Care | Payer: Medicaid Other | Attending: Emergency Medicine | Admitting: Emergency Medicine

## 2022-04-08 DIAGNOSIS — R059 Cough, unspecified: Secondary | ICD-10-CM | POA: Diagnosis present

## 2022-04-08 DIAGNOSIS — Z20822 Contact with and (suspected) exposure to covid-19: Secondary | ICD-10-CM | POA: Insufficient documentation

## 2022-04-08 LAB — RESP PANEL BY RT-PCR (RSV, FLU A&B, COVID)  RVPGX2
Influenza A by PCR: NEGATIVE
Influenza B by PCR: NEGATIVE
Resp Syncytial Virus by PCR: NEGATIVE
SARS Coronavirus 2 by RT PCR: NEGATIVE

## 2022-04-08 NOTE — Discharge Instructions (Signed)
Your daughter has COVID-19, which was discussed with you.  Your test is currently negative, though it is possible that you will quite come positive given that you are caring for your daughter.  Please return for any new, worsening, or change in symptoms or other concerns.  You may continue to take Tylenol/ibuprofen per package instructions to help with your symptoms.  It was a pleasure caring for you today.

## 2022-04-08 NOTE — ED Provider Notes (Signed)
[facility] Provider Note    Event Date/Time   First MD Initiated Contact with Patient 04/08/22 1819     (approximate)   History   congested   HPI  Maria Raymond is a 27 y.o. female who presents today for evaluation of nasal congestion, feeling hot and cold, and cough.  She reports that her child's father recently tested positive for COVID-19, and patient daughter recently stayed with the dad over the weekend.  Patient presents with her daughter who is sick with similar symptoms.  She denies chest pain or shortness of breath.  No abdominal pain, nausea, vomiting, diarrhea.  Patient reports that she has received the COVID vaccinations.  Patient Active Problem List   Diagnosis Date Noted   S/P cesarean section 12/22/2019   Postpartum care following cesarean delivery 12/18/2019   Single liveborn infant, delivered by cesarean 12/18/2019   Encounter for care or examination of lactating mother 12/18/2019   [redacted] weeks gestation of pregnancy 12/16/2019   Renal pelviectasis [id number]   Obesity complicating pregnancy, third trimester 06/23/2019          Physical Exam   Triage Vital Signs: ED Triage Vitals  Enc Vitals Group     BP 04/08/22 1734 123/79     Pulse Rate 04/08/22 1734 90     Resp 04/08/22 1734 15     Temp 04/08/22 1734 98.7 F (37.1 C)     Temp Source 04/08/22 1734 Oral     SpO2 04/08/22 1734 98 %     Weight 04/08/22 1731 217 lb (98.4 kg)     Height 04/08/22 1731 '5\' 5"'$  (1.651 m)     Head Circumference --      Peak Flow --      Pain Score 04/08/22 1731 0     Pain Loc --      Pain Edu? --      Excl. in [place]? --     Most recent vital signs: Vitals:   04/08/22 1734  BP: 123/79  Pulse: 90  Resp: 15  Temp: 98.7 F (37.1 C)  SpO2: 98%    Physical Exam Vitals and nursing note reviewed.  Constitutional:      General: Awake and alert. No acute distress.    Appearance: Normal appearance. The patient is overweight.  HENT:     Head:  Normocephalic and atraumatic.     Mouth: Mucous membranes are moist.  Eyes:     General: PERRL. Normal EOMs        Right eye: No discharge.        Left eye: No discharge.     Conjunctiva/sclera: Conjunctivae normal.  Cardiovascular:     Rate and Rhythm: Normal rate and regular rhythm.     Pulses: Normal pulses.  Pulmonary:     Effort: Pulmonary effort is normal. No respiratory distress.     Breath sounds: Normal breath sounds.  Abdominal:     Abdomen is soft. There is no abdominal tenderness. No rebound or guarding. No distention. Musculoskeletal:        General: No swelling. Normal range of motion.     Cervical back: Normal range of motion and neck supple.  Skin:    General: Skin is warm and dry.     Capillary Refill: Capillary refill takes less than 2 seconds.     Findings: No rash.  Neurological:     Mental Status: The patient is awake and alert.      ED  Results / Procedures / Treatments   Labs (all labs ordered are listed, but only abnormal results are displayed) Labs Reviewed  RESP PANEL BY RT-PCR (RSV, FLU A&B, COVID)  [id number]     EKG     RADIOLOGY     PROCEDURES:  Critical Care performed:   Procedures   MEDICATIONS ORDERED IN ED: Medications - No data to display   IMPRESSION / MDM / [place] / ED COURSE  I reviewed the triage vital signs and the nursing notes.   Differential diagnosis includes, but is not limited to, COVID, flu, RSV, less likely pneumonia or bronchitis.  Patient is awake and alert, hemodynamically stable and afebrile.  She is able to speak easily in complete sentences.  She demonstrates no acute distress.  Patient is COVID/flu/RSV swab is negative.  However, she presents with her daughter who has tested positive for COVID-19.  Given that the patient is caring for her COVID-positive daughter, she requested a work note which was provided.  She was advised that she may very well test positive as well given her close  contact.  We discussed return precautions and the importance of close outpatient follow-up.  Also discussed symptomatic management.  Patient understands and agrees with plan.  She was discharged in stable condition.   Patient's presentation is most consistent with acute complicated illness / injury requiring diagnostic workup.      FINAL CLINICAL IMPRESSION(S) / ED DIAGNOSES   Final diagnoses:  Exposure to COVID-19 virus     Rx / DC Orders   ED Discharge Orders     None        Note:  This document was prepared using Dragon voice recognition software and may include unintentional dictation errors.   Emeline Gins 04/08/22 1843    Vanessa Fritch, MD 04/09/22 1106

## 2022-04-08 NOTE — ED Triage Notes (Signed)
Pt to ED with 27 y/o daughter for covid test. Father tested positive yesterday. States is congested and "hot/cold". Resps unlabored.

## 2022-05-13 ENCOUNTER — Ambulatory Visit (INDEPENDENT_AMBULATORY_CARE_PROVIDER_SITE_OTHER): Payer: Medicaid Other

## 2022-05-13 ENCOUNTER — Other Ambulatory Visit (HOSPITAL_COMMUNITY)
Admission: RE | Admit: 2022-05-13 | Discharge: 2022-05-13 | Disposition: A | Discharge status: Home / Self Care | Payer: Medicaid Other | Source: Ambulatory Visit | Attending: Obstetrics and Gynecology | Admitting: Obstetrics and Gynecology

## 2022-05-13 VITALS — BP 116/60 | Ht 65.0 in | Wt 218.0 lb

## 2022-05-13 DIAGNOSIS — Z113 Encounter for screening for infections with a predominantly sexual mode of transmission: Secondary | ICD-10-CM

## 2022-05-13 DIAGNOSIS — N760 Acute vaginitis: Secondary | ICD-10-CM | POA: Insufficient documentation

## 2022-05-13 DIAGNOSIS — Z3042 Encounter for surveillance of injectable contraceptive: Secondary | ICD-10-CM

## 2022-05-13 DIAGNOSIS — B9689 Other specified bacterial agents as the cause of diseases classified elsewhere: Secondary | ICD-10-CM | POA: Diagnosis present

## 2022-05-13 MED ORDER — MEDROXYPROGESTERONE ACETATE 150 MG/ML IM SUSY
150.0000 mg | PREFILLED_SYRINGE | Freq: Once | INTRAMUSCULAR | Status: AC
Start: 2022-05-13 — End: 2022-05-13
  Administered 2022-05-13: 150 mg via INTRAMUSCULAR

## 2022-05-13 NOTE — Patient Instructions (Signed)
Medroxyprogesterone Injection (Contraception) What is this medication? MEDROXYPROGESTERONE (me DROX ee proe JES te rone) prevents ovulation and pregnancy. It belongs to a group of medications called contraceptives. This medication is a progestin hormone. This medicine may be used for other purposes; ask your health care provider or pharmacist if you have questions. COMMON BRAND NAME(S): Depo-Provera, Depo-subQ Provera 104 What should I tell my care team before I take this medication? They need to know if you have any of these conditions: Asthma Blood clots Breast cancer or family history of breast cancer Depression Diabetes Eating disorder (anorexia nervosa) Frequently drink alcohol Heart attack High blood pressure HIV infection or AIDS Kidney disease Liver disease Migraine headaches Osteoporosis, weak bones Seizures Stroke Tobacco use Vaginal bleeding An unusual or allergic reaction to medroxyprogesterone, other medications, foods, dyes, or preservatives Pregnant or trying to get pregnant Breast-feeding How should I use this medication? Depo-Provera CI contraceptive injection is given into a muscle. Depo-subQ Provera 104 injection is given under the skin. It is given in a hospital or clinic setting. The injection is usually given during the first 5 days after the start of a menstrual period or 6 weeks after delivery of a baby. A patient package insert for the product will be given with each prescription and refill. Be sure to read this information carefully each time. The sheet may change often. Talk to your care team about the use of this medication in children. Special care may be needed. These injections have been used in female children who have started having menstrual periods. Overdosage: If you think you have taken too much of this medicine contact a poison control center or emergency room at once. NOTE: This medicine is only for you. Do not share this medicine with  others. What if I miss a dose? Keep appointments for follow-up doses. You must get an injection once every 3 months. It is important not to miss your dose. Call your care team if you are unable to keep an appointment. What may interact with this medication? Antibiotics or medications for infections, especially rifampin and griseofulvin Antivirals for HIV or hepatitis Aprepitant Armodafinil Bexarotene Bosentan Medications for seizures, such as carbamazepine, felbamate, oxcarbazepine, phenytoin, phenobarbital, primidone, topiramate Mitotane Modafinil St. John's Wort This list may not describe all possible interactions. Give your health care provider a list of all the medicines, herbs, non-prescription drugs, or dietary supplements you use. Also tell them if you smoke, drink alcohol, or use illegal drugs. Some items may interact with your medicine. What should I watch for while using this medication? This medication does not protect you against HIV infection (AIDS) or other sexually transmitted diseases. Use of this product may cause you to lose calcium from your bones. Loss of calcium may cause weak bones (osteoporosis). Only use this product for more than 2 years if other forms of birth control are not right for you. The longer you use this product for birth control the more likely you will be at risk for weak bones. Ask your care team how you can keep strong bones. You may have a change in bleeding pattern or irregular periods. Many females stop having periods while taking this medication. If you have received your injections on time, your chance of being pregnant is very low. If you think you may be pregnant, see your care team as soon as possible. Tell your care team if you want to get pregnant within the next year. The effect of this medication may last a long time  after you get your last injection. What side effects may I notice from receiving this medication? Side effects that you should  report to your care team as soon as possible: Allergic reactions--skin rash, itching, hives, swelling of the face, lips, tongue, or throat Blood clot--pain, swelling, or warmth in the leg, shortness of breath, chest pain Gallbladder problems--severe stomach pain, nausea, vomiting, fever Increase in blood pressure Liver injury--right upper belly pain, loss of appetite, nausea, light-colored stool, dark yellow or brown urine, yellowing skin or eyes, unusual weakness or fatigue New or worsening migraines or headaches Seizures Stroke--sudden numbness or weakness of the face, arm, or leg, trouble speaking, confusion, trouble walking, loss of balance or coordination, dizziness, severe headache, change in vision Unusual vaginal discharge, itching, or odor Worsening mood, feelings of depression Side effects that usually do not require medical attention (report to your care team if they continue or are bothersome): Breast pain or tenderness Dark patches of the skin on the face or other sun-exposed areas Irregular menstrual cycles or spotting Nausea Weight gain This list may not describe all possible side effects. Call your doctor for medical advice about side effects. You may report side effects to FDA at 1-800-FDA-1088. Where should I keep my medication? This injection is only given by a care team. It will not be stored at home. NOTE: This sheet is a summary. It may not cover all possible information. If you have questions about this medicine, talk to your doctor, pharmacist, or health care provider.  2023 Elsevier/Gold Standard (2020-03-31 00:00:00)  Bacterial Vaginosis  Bacterial vaginosis is an infection of the vagina. It happens when too many normal germs (healthy bacteria) grow in the vagina. This infection can make it easier to get other infections from sex (STIs). It is very important for pregnant women to get treated. This infection can cause babies to be born early or at a low birth  weight. What are the causes? This infection is caused by an increase in certain germs that grow in the vagina. You cannot get this infection from toilet seats, bedsheets, swimming pools, or things that touch your vagina. What increases the risk? Having sex with a new person or more than one person. Having sex without protection. Douching. Having an intrauterine device (IUD). Smoking. Using drugs or drinking alcohol. These can lead you to do things that are risky. Taking certain antibiotic medicines. Being pregnant. What are the signs or symptoms? Some women have no symptoms. Symptoms may include: A discharge from your vagina. It may be gray or white. It can be watery or foamy. A fishy smell. This can happen after sex or during your menstrual period. Itching in and around your vagina. A feeling of burning or pain when you pee (urinate). How is this treated? This infection is treated with antibiotic medicines. These may be given to you as: A pill. A cream for your vagina. A medicine that you put into your vagina (suppository). If the infection comes back after treatment, you may need more antibiotics. Follow these instructions at home: Medicines Take over-the-counter and prescription medicines as told by your doctor. Take or use your antibiotic medicine as told by your doctor. Do not stop taking or using it, even if you start to feel better. General instructions If the person you have sex with is a woman, tell her that you have this infection. She will need to follow up with her doctor. If you have a female partner, he does not need to be treated. Do  not have sex until you finish treatment. Drink enough fluid to keep your pee pale yellow. Keep your vagina and butt clean. Wash the area with warm water each day. Wipe from front to back after you use the toilet. If you are breastfeeding a baby, ask your doctor if you should keep doing so during treatment. Keep all follow-up visits. How  is this prevented? Self-care Do not douche. Use only warm water to wash around your vagina. Wear underwear that is cotton or lined with cotton. Do not wear tight pants and pantyhose, especially in the summer. Safe sex Use protection when you have sex. This includes: Use condoms. Use dental dams. This is a thin layer that protects the mouth during oral sex. Limit how many people you have sex with. To prevent this infection, it is best to have sex with just one person. Get tested for STIs. The person you have sex with should also get tested. Drugs and alcohol Do not smoke or use any products that contain nicotine or tobacco. If you need help quitting, ask your doctor. Do not use drugs. Do not drink alcohol if: Your doctor tells you not to drink. You are pregnant, may be pregnant, or are planning to become pregnant. If you drink alcohol: Limit how much you have to 0-1 drink a day. Know how much alcohol is in your drink. In the [place] one drink equals one 12 oz bottle of beer (355 mL), one 5 oz glass of wine (148 mL), or one 1 oz glass of hard liquor (44 mL). Where to find more information Centers for Disease Control and Prevention: http://www.wolf.info/ American Sexual Health Association: www.ashastd.org Office on [employer] Health: [place] Contact a doctor if: Your symptoms do not get better, even after you are treated. You have more discharge or pain when you pee. You have a fever or chills. You have pain in your belly (abdomen) or in the area between your hips. You have pain with sex. You bleed from your vagina between menstrual periods. Summary This infection can happen when too many germs (bacteria) grow in the vagina. This infection can make it easier to get infections from sex (STIs). Treating this can lower that chance. Get treated if you are pregnant. This infection can cause babies to be born early. Do not stop taking or using your antibiotic medicine, even if you start to  feel better. This information is not intended to replace advice given to you by your health care provider. Make sure you discuss any questions you have with your health care provider. Document Revised: 07/27/2019 Document Reviewed: 07/27/2019 Elsevier Patient Education  [place].

## 2022-05-13 NOTE — Progress Notes (Signed)
    NURSE VISIT NOTE  Subjective:    Patient ID: Maria Raymond, female    DOB: 02/23/1995, 27 y.o.   MRN: [id number]  HPI  Patient is a 27 y.o. G72P1001 female who presents for depo provera injection. Patient would like to be tested for BV, yeast, and STD's. She did not finish her last treatment of BV. Denies vaginal discharge, odor, itching. She has been having some spotting (when wiping only) for the last 4 weeks. She has not been sexually active in the last year. Aptima sent to lab, will wait for results to be treated if needed.   Objective:    BP 116/60   Ht 5\' 5"  (1.651 m)   Wt 218 lb (98.9 kg)   BMI 36.28 kg/m   Last Annual: 11/11/21. Last pap: 06/23/19. Last Depo-Provera: 02/18/22. Side Effects if any: none. Serum HCG indicated? No . Depo-Provera 150 mg IM given by: Drenda Freeze, CMA. Site: Right Deltoid   Assessment:   1. Bacterial vaginosis   2. Screening for STD (sexually transmitted disease)   3. Encounter for surveillance of injectable contraceptive      Plan:   Next appointment due between June 19 and July 3.    Drenda Freeze, CMA

## 2022-05-15 LAB — CERVICOVAGINAL ANCILLARY ONLY
Bacterial Vaginitis (gardnerella): POSITIVE — AB
Candida Glabrata: NEGATIVE
Candida Vaginitis: NEGATIVE
Chlamydia: NEGATIVE
Comment: NEGATIVE
Comment: NEGATIVE
Comment: NEGATIVE
Comment: NEGATIVE
Comment: NEGATIVE
Comment: NORMAL
Neisseria Gonorrhea: NEGATIVE
Trichomonas: NEGATIVE

## 2022-05-16 ENCOUNTER — Other Ambulatory Visit: Payer: Self-pay | Admitting: Obstetrics and Gynecology

## 2022-05-16 ENCOUNTER — Encounter: Payer: Self-pay | Admitting: Obstetrics and Gynecology

## 2022-05-16 DIAGNOSIS — B9689 Other specified bacterial agents as the cause of diseases classified elsewhere: Secondary | ICD-10-CM

## 2022-05-16 MED ORDER — METRONIDAZOLE 500 MG PO TABS: 500.0000 mg | ORAL_TABLET | Freq: Two times a day (BID) | ORAL | 0 refills | Status: DC

## 2022-08-05 ENCOUNTER — Ambulatory Visit: Payer: Medicaid Other

## 2022-09-07 ENCOUNTER — Ambulatory Visit (INDEPENDENT_AMBULATORY_CARE_PROVIDER_SITE_OTHER): Payer: Medicaid Other

## 2022-09-07 VITALS — BP 134/86 | HR 79 | Wt 233.4 lb

## 2022-09-07 DIAGNOSIS — Z3202 Encounter for pregnancy test, result negative: Secondary | ICD-10-CM | POA: Diagnosis not present

## 2022-09-07 DIAGNOSIS — Z3042 Encounter for surveillance of injectable contraceptive: Secondary | ICD-10-CM | POA: Diagnosis not present

## 2022-09-07 LAB — POCT URINE PREGNANCY: Preg Test, Ur: NEGATIVE

## 2022-09-07 MED ORDER — MEDROXYPROGESTERONE ACETATE 150 MG/ML IM SUSP
150.0000 mg | Freq: Once | INTRAMUSCULAR | Status: AC
Start: 2022-09-07 — End: 2022-09-07
  Administered 2022-09-07: 150 mg via INTRAMUSCULAR

## 2022-09-07 NOTE — Patient Instructions (Signed)

## 2022-09-07 NOTE — Progress Notes (Signed)
    NURSE VISIT NOTE  Subjective:    Patient ID: Maria Raymond, female    DOB: May 26, 1995, 27 y.o.   MRN: [id number]  HPI  Patient is a 27 y.o. G33P1001 female who presents for depo provera injection.   Objective:    BP 134/86   Pulse 79   Wt 233 lb 6.4 oz (105.9 kg)   BMI 38.84 kg/m   Last Annual: 11/11/21. Last pap: 06/23/19. Last Depo-Provera: 05/13/22. Side Effects if any: none. Serum HCG indicated? No . Depo-Provera 150 mg IM given by: Sheliah Hatch, CMA. Site: Right Deltoid ( patient requested )  Lab Review  POCT-Urine Pregnancy  Assessment:   1. Encounter for surveillance of injectable contraceptive      Plan:   Next appointment due between 10-14 and 12-07-22.    [name], CMA

## 2022-11-09 ENCOUNTER — Encounter: Payer: Self-pay | Admitting: Advanced Practice Midwife

## 2022-11-09 ENCOUNTER — Other Ambulatory Visit: Payer: Self-pay | Admitting: Certified Nurse Midwife

## 2022-11-09 ENCOUNTER — Telehealth: Payer: Self-pay

## 2022-11-09 NOTE — Telephone Encounter (Signed)
norgestimate-ethinyl estradiol (ORTHO-CYCLEN) 0.25-35 MG-MCG tablet after looking in her chart and seeing from the past note from 03/11/2020 it looks like Ortho-Cyclen was sent in

## 2022-11-09 NOTE — Telephone Encounter (Signed)
Maria Raymond called triage stating she has been bleeding for 3 weeks straight, she's currently on Depo. She states that this has happened before and a medication was sent in to help her stop bleeding. She is wanting to know if we can send in something to stop the bleeding.

## 2022-11-10 ENCOUNTER — Other Ambulatory Visit: Payer: Self-pay | Admitting: Certified Nurse Midwife

## 2022-11-10 MED ORDER — NORGESTIMATE-ETH ESTRADIOL 0.25-35 MG-MCG PO TABS: 1.0000 | ORAL_TABLET | Freq: Every day | ORAL | 0 refills | Status: DC

## 2022-11-10 NOTE — Progress Notes (Signed)
Pt having heavy bleeding with depo injections. She notes that this happened to her a few years ago and was given some medication to help it stop . After reviewing her chart it was noted that she was ordered ortho-cyclen : Take 3 tabs po daily 3 days, then 2 tabs po daily for 3 days, then 1 tab po daily to finish pack.   Orders placed.   Doreene Burke, CNM

## 2022-11-26 ENCOUNTER — Other Ambulatory Visit: Payer: Self-pay | Admitting: Advanced Practice Midwife

## 2022-11-26 DIAGNOSIS — N926 Irregular menstruation, unspecified: Secondary | ICD-10-CM

## 2022-11-26 MED ORDER — NORGESTIMATE-ETH ESTRADIOL 0.25-35 MG-MCG PO TABS: 1.0000 | ORAL_TABLET | Freq: Every day | ORAL | 0 refills | Status: DC

## 2022-11-26 NOTE — Progress Notes (Signed)
Refill of ortho cyclen sent per patient request

## 2022-11-26 NOTE — Telephone Encounter (Signed)
Patient states she misplaced some of her pills and also wasn't taking them right. She doesn't have any refills. She states her bleeding has decreased, but she is still bleeding. Requesting refill.

## 2022-11-27 NOTE — Telephone Encounter (Signed)
Pt called office to f/u on Rx, hasn't heard back from [place]. Advised Rx sent yesterday late evening.

## 2022-11-30 ENCOUNTER — Ambulatory Visit: Payer: Medicaid Other

## 2022-11-30 VITALS — BP 136/86 | Wt 231.9 lb

## 2022-11-30 DIAGNOSIS — Z3042 Encounter for surveillance of injectable contraceptive: Secondary | ICD-10-CM

## 2022-11-30 MED ORDER — MEDROXYPROGESTERONE ACETATE 150 MG/ML IM SUSY
150.0000 mg | PREFILLED_SYRINGE | Freq: Once | INTRAMUSCULAR | Status: AC
Start: 2022-11-30 — End: 2022-11-30
  Administered 2022-11-30: 150 mg via INTRAMUSCULAR

## 2022-11-30 NOTE — Progress Notes (Signed)
NURSE VISIT NOTE  Subjective:    Patient ID: Maria Raymond, female    DOB: 10-21-1995, 27 y.o.   MRN: [id number]  HPI  Patient is a 27 y.o. G82P1001 female who presents for depo provera injection.   Objective:    BP 136/86   Wt 231 lb 14.4 oz (105.2 kg)   BMI 38.59 kg/m   Last Annual: 11/11/2021. Last pap: 06/23/2019. Last Depo-Provera: 09/07/2022. Side Effects if any: none. Serum HCG indicated? No . Depo-Provera 150 mg IM given by: Doristine Devoid, CMA. Site: Left Deltoid  Lab Review    Assessment:   1. Encounter for surveillance of injectable contraceptive      Plan:   Next appointment due between Jan 6th thru Jan 20th.    Burtis Junes, CMA

## 2022-12-14 ENCOUNTER — Telehealth: Payer: Self-pay

## 2022-12-14 NOTE — Telephone Encounter (Signed)
I spoke with the patient via phone. I advised we needed to scheduled annual exam. The call dropped. I attempt to reach the patient no answer. The patient is scheduled for next available with Althea Grimmer on Tuesday, 11/19 at 2:55 pm. Awaiting confirmation

## 2022-12-14 NOTE — Telephone Encounter (Signed)
Patient states she has been on Depo since 2021. She had break through bleeding last month that was treated with OCP. She reports she started bleeding about two days after taking OCP's as prescribed. She is requesting a refill. Patient is interested in discussed other bc options.  Chart reviewed. Patient is past due for annual as of 11/11/21. Attempted to transfer to front desk to schedule annual. Call dropped. Message to [employer] for scheduling. Provider for review of refill of OCP til appt.

## 2022-12-14 NOTE — Telephone Encounter (Signed)
I contacted the patient via phone. No answer. The patient needs annual exam.

## 2022-12-15 ENCOUNTER — Other Ambulatory Visit: Payer: Self-pay | Admitting: Certified Nurse Midwife

## 2022-12-15 DIAGNOSIS — N926 Irregular menstruation, unspecified: Secondary | ICD-10-CM

## 2022-12-15 MED ORDER — NORGESTIMATE-ETH ESTRADIOL 0.25-35 MG-MCG PO TABS
1.0000 | ORAL_TABLET | Freq: Every day | ORAL | 1 refills | Status: DC
Start: 2022-12-15 — End: 2023-08-19

## 2022-12-15 NOTE — Addendum Note (Signed)
Addended by: Loran Senters D on: 12/15/2022 11:29 AM   Modules accepted: Orders

## 2022-12-15 NOTE — Telephone Encounter (Signed)
The patient is confirmed for 01/21/23.

## 2022-12-15 NOTE — Telephone Encounter (Signed)
Attempt to reach the patient on both phone numbers on file. No answer, unable to leave message.

## 2022-12-15 NOTE — Telephone Encounter (Signed)
Patient aware. Also advised Doreene Burke sent refill of norgestimate-ethinyl estradiol (ESTARYLLA) 0.25-35 MG-MCG tablet today.

## 2022-12-15 NOTE — Telephone Encounter (Signed)
Last depo given 10/24, BC not due till 1/25. Will discuss at annual and send Rx then.

## 2022-12-29 ENCOUNTER — Ambulatory Visit: Payer: Medicaid Other | Admitting: Obstetrics and Gynecology

## 2023-01-19 NOTE — Progress Notes (Unsigned)
PCP:  [facility], [facility]   No chief complaint on file.    HPI:      [name] is a 27 y.o. G1P1001 whose LMP was No LMP recorded. Patient has had an injection., presents today for her annual examination.  Her menses are {norm/abn:715}, lasting {number: 22536} days.  Dysmenorrhea {dysmen:716}. She {does:18564} have intermenstrual bleeding.  Sex activity: single partner, contraception - Depo-Provera injections.  Last Pap: 06/23/19 Results were: no abnormalities  Hx of STDs: {STD hx:14358}  Hx of BV in past, confirmed on culure  There is no FH of breast cancer. There is no FH of ovarian cancer. The patient {does:18564} do self-breast exams.  Tobacco use: {tob:20664} Alcohol use: {Alcohol:11675} No drug use.  Exercise: {exercise:31265}  She {does:18564} get adequate calcium and Vitamin D in her diet.  Patient Active Problem List   Diagnosis Date Noted   S/P cesarean section 12/22/2019   Postpartum care following cesarean delivery 12/18/2019   Single liveborn infant, delivered by cesarean 12/18/2019   Encounter for care or examination of lactating mother 12/18/2019   [redacted] weeks gestation of pregnancy 12/16/2019   Renal pelviectasis 08/20/2019   Obesity complicating pregnancy, third trimester 06/23/2019    Past Surgical History:  Procedure Laterality Date   CESAREAN SECTION  12/16/2019   Procedure: CESAREAN SECTION;  Surgeon: Nadara Mustard, MD;  Location: [facility] ORS;  Service: Obstetrics;;   TONSILLECTOMY      Family History  Problem Relation Age of Onset   Colon cancer Maternal Grandfather     Social History   Socioeconomic History   Marital status: Single    Spouse name: Not on file   Number of children: Not on file   Years of education: Not on file   Highest education level: Not on file  Occupational History   Not on file  Tobacco Use   Smoking status: Every Day    Current packs/day: 0.25    Types: Cigarettes   Smokeless tobacco:  Never  Vaping Use   Vaping status: Some Days  Substance and Sexual Activity   Alcohol use: Yes   Drug use: Never   Sexual activity: Not Currently    Birth control/protection: Injection  Other Topics Concern   Not on file  Social History Narrative   Not on file   Social Determinants of Health   Financial Resource Strain: Medium Risk (06/12/2021)   Received from [facility] [place], [facility]   Overall Financial Resource Strain (CARDIA)    Difficulty of Paying Living Expenses: Somewhat hard  Food Insecurity: No Food Insecurity (06/12/2021)   Received from [facility], [facility]   Hunger Vital Sign    Worried About Running Out of Food in the Last Year: Never true    Ran Out of Food in the Last Year: Never true  Transportation Needs: No Transportation Needs (06/12/2021)   Received from [facility], [facility]   [facility] - Transportation    Lack of Transportation (Medical): No    Lack of Transportation (Non-Medical): No  Physical Activity: Not on file  Stress: Not on file  Social Connections: Not on file  Intimate Partner Violence: Not on file     Current Outpatient Medications:    metroNIDAZOLE (FLAGYL) 500 MG tablet, Take 1 tablet (500 mg total) by mouth 2 (two) times daily. (Patient not taking: Reported on 09/07/2022), Disp: 14 tablet, Rfl: 0   medroxyPROGESTERone (DEPO-PROVERA) 150 MG/ML injection, Inject  1 mL (150 mg total) into the muscle every 3 (three) months for 1 dose., Disp: 1 mL, Rfl: 0   norgestimate-ethinyl estradiol (ESTARYLLA) 0.25-35 MG-MCG tablet, Take 1 tablet by mouth daily., Disp: 28 tablet, Rfl: 1   silver sulfADIAZINE (SILVADENE) 1 % cream, Apply to affected area daily, Disp: 50 g, Rfl: 1   valACYclovir (VALTREX) 1000 MG tablet, Take 0.5 tablets (500 mg total) by mouth 2 (two) times daily., Disp: 60 tablet, Rfl: 1     ROS:  Review of Systems BREAST: No symptoms   Objective: There were no vitals taken for this visit.   OBGyn  Exam  Results: No results found for this or any previous visit (from the past 24 hour(s)).  Assessment/Plan: No diagnosis found.  No orders of the defined types were placed in this encounter.            GYN counsel {counseling: 16159}     F/U  No follow-ups on file.  [name] B. Jovan Schickling, PA-C 01/19/2023 2:02 PM

## 2023-01-21 ENCOUNTER — Ambulatory Visit: Payer: 59 | Admitting: Obstetrics and Gynecology

## 2023-01-21 DIAGNOSIS — Z113 Encounter for screening for infections with a predominantly sexual mode of transmission: Secondary | ICD-10-CM

## 2023-01-21 DIAGNOSIS — Z01419 Encounter for gynecological examination (general) (routine) without abnormal findings: Secondary | ICD-10-CM

## 2023-01-21 DIAGNOSIS — Z3042 Encounter for surveillance of injectable contraceptive: Secondary | ICD-10-CM

## 2023-02-22 ENCOUNTER — Ambulatory Visit: Payer: 59

## 2023-02-22 NOTE — Progress Notes (Deleted)
 PCP:  [facility], [facility]   No chief complaint on file.    HPI:      [name] is a 28 y.o. G1P1001 whose LMP was No LMP recorded. Patient has had an injection., presents today for her annual examination.  Her menses are absent due to depo, no BTB.   Sex activity: {sex active: 315163}.  Last Pap: 06/23/19 Results were: no abnormalities  Hx of STDs: trichomonas 5/21 with neg TOC  There is no FH of breast cancer. There is no FH of ovarian cancer. The patient {does:18564} do self-breast exams.  Tobacco use: {tob:20664} Alcohol use: {Alcohol:11675} No drug use.  Exercise: {exercise:31265}  She {does:18564} get adequate calcium and Vitamin D in her diet.  Patient Active Problem List   Diagnosis Date Noted   S/P cesarean section 12/22/2019   Postpartum care following cesarean delivery 12/18/2019   Single liveborn infant, delivered by cesarean 12/18/2019   Encounter for care or examination of lactating mother 12/18/2019   [redacted] weeks gestation of pregnancy 12/16/2019   Renal pelviectasis 08/20/2019   Obesity complicating pregnancy, third trimester 06/23/2019    Past Surgical History:  Procedure Laterality Date   CESAREAN SECTION  12/16/2019   Procedure: CESAREAN SECTION;  Surgeon: Arloa Lamar SQUIBB, MD;  Location: [facility] ORS;  Service: Obstetrics;;   TONSILLECTOMY      Family History  Problem Relation Age of Onset   Colon cancer Maternal Grandfather     Social History   Socioeconomic History   Marital status: Single    Spouse name: Not on file   Number of children: Not on file   Years of education: Not on file   Highest education level: Not on file  Occupational History   Not on file  Tobacco Use   Smoking status: Every Day    Current packs/day: 0.25    Types: Cigarettes   Smokeless tobacco: Never  Vaping Use   Vaping status: Some Days  Substance and Sexual Activity   Alcohol use: Yes   Drug use: Never   Sexual activity: Not Currently     Birth control/protection: Injection  Other Topics Concern   Not on file  Social History Narrative   Not on file   Social Drivers of Health   Financial Resource Strain: Medium Risk (06/12/2021)   Received from [place], [facility]   Overall Financial Resource Strain (CARDIA)    Difficulty of Paying Living Expenses: Somewhat hard  Food Insecurity: No Food Insecurity (06/12/2021)   Received from [facility], [facility]   Hunger Vital Sign    Worried About Running Out of Food in the Last Year: Never true    Ran Out of Food in the Last Year: Never true  Transportation Needs: No Transportation Needs (06/12/2021)   Received from [facility]   [facility] - Transportation    Lack of Transportation (Medical): No    Lack of Transportation (Non-Medical): No  Physical Activity: Not on file  Stress: Not on file  Social Connections: Not on file  Intimate Partner Violence: Not on file     Current Outpatient Medications:    metroNIDAZOLE  (FLAGYL ) 500 MG tablet, Take 1 tablet (500 mg total) by mouth 2 (two) times daily. (Patient not taking: Reported on 09/07/2022), Disp: 14 tablet, Rfl: 0   medroxyPROGESTERone  (DEPO-PROVERA ) 150 MG/ML injection, Inject 1 mL (150 mg total) into the muscle every 3 (three) months for 1 dose.,  Disp: 1 mL, Rfl: 0   norgestimate -ethinyl estradiol  (ESTARYLLA) 0.25-35 MG-MCG tablet, Take 1 tablet by mouth daily., Disp: 28 tablet, Rfl: 1   silver  sulfADIAZINE  (SILVADENE ) 1 % cream, Apply to affected area daily, Disp: 50 g, Rfl: 1   valACYclovir  (VALTREX ) 1000 MG tablet, Take 0.5 tablets (500 mg total) by mouth 2 (two) times daily., Disp: 60 tablet, Rfl: 1     ROS:  Review of Systems BREAST: No symptoms   Objective: There were no vitals taken for this visit.   OBGyn Exam  Results: No results found for this or any previous visit (from the past 24 hours).  Assessment/Plan: No diagnosis found.  No orders of the defined types  were placed in this encounter.            GYN counsel {counseling: 16159}     F/U  No follow-ups on file.  Yoana Staib B. Seddrick Flax, PA-C 02/22/2023 4:45 PM

## 2023-02-23 ENCOUNTER — Ambulatory Visit: Payer: 59 | Admitting: Obstetrics and Gynecology

## 2023-02-23 DIAGNOSIS — Z3042 Encounter for surveillance of injectable contraceptive: Secondary | ICD-10-CM

## 2023-02-23 DIAGNOSIS — Z124 Encounter for screening for malignant neoplasm of cervix: Secondary | ICD-10-CM

## 2023-02-23 DIAGNOSIS — Z01419 Encounter for gynecological examination (general) (routine) without abnormal findings: Secondary | ICD-10-CM

## 2023-03-02 ENCOUNTER — Ambulatory Visit (INDEPENDENT_AMBULATORY_CARE_PROVIDER_SITE_OTHER): Payer: 59

## 2023-03-02 VITALS — BP 132/87 | HR 94 | Ht 65.0 in | Wt 228.8 lb

## 2023-03-02 DIAGNOSIS — Z3202 Encounter for pregnancy test, result negative: Secondary | ICD-10-CM

## 2023-03-02 DIAGNOSIS — Z3042 Encounter for surveillance of injectable contraceptive: Secondary | ICD-10-CM

## 2023-03-02 LAB — POCT URINE PREGNANCY: Preg Test, Ur: NEGATIVE

## 2023-03-02 MED ORDER — MEDROXYPROGESTERONE ACETATE 150 MG/ML IM SUSP
150.0000 mg | Freq: Once | INTRAMUSCULAR | Status: AC
  Administered 2023-03-02: 150 mg via INTRAMUSCULAR

## 2023-03-02 NOTE — Patient Instructions (Signed)

## 2023-03-02 NOTE — Progress Notes (Signed)
    NURSE VISIT NOTE  Subjective:    Patient ID: Maria Raymond, female    DOB: 1995/02/12, 28 y.o.   MRN: [id number]  HPI  Patient is a 28 y.o. G62P1001 female who presents for depo provera injection.   Objective:    BP 132/87   Pulse 94   Ht 5\' 5"  (1.651 m)   Wt 228 lb 12.8 oz (103.8 kg)   BMI 38.07 kg/m   Last Annual: 11/11/2021. Last pap: 06/23/2019. Last Depo-Provera: 11/30/2022. Side Effects if any: none. Serum HCG indicated? No . Depo-Provera 150 mg IM given by: Sheliah Hatch, CMA. Site: Right Deltoid  Lab Review  POCT Urine pregnancy- Negative  Assessment:   1. Encounter for surveillance of injectable contraceptive      Plan:   Next appointment due between 05/18/23 and 06/01/23.    [name], CMA

## 2023-03-15 NOTE — Progress Notes (Unsigned)
PCP:  [name]   No chief complaint on file.    HPI:      [name] is a 28 y.o. G1P1001 whose LMP was No LMP recorded. Patient has had an injection., presents today for her annual examination.  Her menses are absent due to depo, no BTB.   Sex activity: {sex active: 315163}.  Last Pap: 06/23/19 Results were: no abnormalities  Hx of STDs: trichomonas 5/21 with neg TOC  There is no FH of breast cancer. There is no FH of ovarian cancer. The patient {does:18564} do self-breast exams.  Tobacco use: {tob:20664} Alcohol use: {Alcohol:11675} No drug use.  Exercise: {exercise:31265}  She {does:18564} get adequate calcium and Vitamin D in her diet.  Patient Active Problem List   Diagnosis Date Noted   S/P cesarean section 12/22/2019   Postpartum care following cesarean delivery 12/18/2019   Single liveborn infant, delivered by cesarean 12/18/2019   Encounter for care or examination of lactating mother 12/18/2019   [redacted] weeks gestation of pregnancy 12/16/2019   Renal pelviectasis 08/20/2019   Obesity complicating pregnancy, third trimester 06/23/2019    Past Surgical History:  Procedure Laterality Date   CESAREAN SECTION  12/16/2019   Procedure: CESAREAN SECTION;  Surgeon: Nadara Mustard, MD;  Location: [facility] ORS;  Service: Obstetrics;;   TONSILLECTOMY      Family History  Problem Relation Age of Onset   Colon cancer Maternal Grandfather     Social History   Socioeconomic History   Marital status: Single    Spouse name: Not on file   Number of children: Not on file   Years of education: Not on file   Highest education level: Not on file  Occupational History   Not on file  Tobacco Use   Smoking status: Every Day    Current packs/day: 0.25    Types: Cigarettes   Smokeless tobacco: Never  Vaping Use   Vaping status: Some Days  Substance and Sexual Activity   Alcohol use: Yes   Drug use: Never   Sexual activity: Not Currently     Birth control/protection: Injection  Other Topics Concern   Not on file  Social History Narrative   Not on file   Social Drivers of Health   Financial Resource Strain: Medium Risk (06/12/2021)   [facility]   Overall Financial Resource Strain (CARDIA)    Difficulty of Paying Living Expenses: Somewhat hard  Food Insecurity: No Food Insecurity (06/12/2021)   Received [facility], [facility]   Hunger Vital Sign    Worried About Running Out of Food in the Last Year: Never true    Ran Out of Food in the Last Year: Never true  Transportation Needs: No Transportation Needs (06/12/2021)   Received [facility], [facility]   [facility] - Transportation    Lack of Transportation (Medical): No    Lack of Transportation (Non-Medical): No  Physical Activity: Not on file  Stress: Not on file  Social Connections: Not on file  Intimate Partner Violence: Not on file     Current Outpatient Medications:    metroNIDAZOLE (FLAGYL) 500 MG tablet, Take 1 tablet (500 mg total) by mouth 2 (two) times daily. (Patient not taking: Reported on 09/07/2022), Disp: 14 tablet, Rfl: 0   medroxyPROGESTERone (DEPO-PROVERA) 150 MG/ML injection, Inject 1 mL (150 mg total) into the muscle every 3 (three) months for 1 dose.,  Disp: 1 mL, Rfl: 0   norgestimate-ethinyl estradiol (ESTARYLLA) 0.25-35 MG-MCG tablet, Take 1 tablet by mouth daily., Disp: 28 tablet, Rfl: 1   silver sulfADIAZINE (SILVADENE) 1 % cream, Apply to affected area daily, Disp: 50 g, Rfl: 1   valACYclovir (VALTREX) 1000 MG tablet, Take 0.5 tablets (500 mg total) by mouth 2 (two) times daily., Disp: 60 tablet, Rfl: 1     ROS:  Review of Systems BREAST: No symptoms   Objective: There were no vitals taken for this visit.   OBGyn Exam  Results: No results found for this or any previous visit (from the past 24 hours).  Assessment/Plan: No diagnosis found.  No orders of the defined types  were placed in this encounter.            GYN counsel {counseling: 16159}     F/U  No follow-ups on file.  Laith Antonelli B. Siarah Deleo, PA-C 03/15/2023 8:49 PM

## 2023-03-16 ENCOUNTER — Ambulatory Visit: Payer: 59 | Admitting: Obstetrics and Gynecology

## 2023-03-16 DIAGNOSIS — Z124 Encounter for screening for malignant neoplasm of cervix: Secondary | ICD-10-CM

## 2023-03-16 DIAGNOSIS — Z3042 Encounter for surveillance of injectable contraceptive: Secondary | ICD-10-CM

## 2023-03-16 DIAGNOSIS — Z01419 Encounter for gynecological examination (general) (routine) without abnormal findings: Secondary | ICD-10-CM

## 2023-03-17 ENCOUNTER — Encounter: Payer: Self-pay | Admitting: Obstetrics and Gynecology

## 2023-05-26 ENCOUNTER — Ambulatory Visit: Payer: 59

## 2023-05-26 VITALS — BP 112/79 | HR 83 | Temp 98.0°F | Ht 65.0 in | Wt 230.0 lb

## 2023-05-26 DIAGNOSIS — Z3042 Encounter for surveillance of injectable contraceptive: Secondary | ICD-10-CM | POA: Diagnosis not present

## 2023-05-26 MED ORDER — MEDROXYPROGESTERONE ACETATE 150 MG/ML IM SUSY
150.0000 mg | PREFILLED_SYRINGE | Freq: Once | INTRAMUSCULAR | Status: AC
  Administered 2023-05-26: 150 mg via INTRAMUSCULAR

## 2023-05-26 NOTE — Progress Notes (Signed)
    NURSE VISIT NOTE  Subjective:    Patient ID: Maria Raymond, female    DOB: 1995/06/18, 28 y.o.   MRN: [id number]  HPI  Patient is a 28 y.o. G8P1001 female who presents for depo provera injection.   Objective:    BP 112/79   Pulse 83   Temp 98 F (36.7 C)   Ht 5\' 5"  (1.651 m)   Wt 230 lb (104.3 kg)   BMI 38.27 kg/m   Last Annual: 11/11/21. Last pap: 06/23/19. Last Depo-Provera: 03/02/23. Side Effects if any: none. Serum HCG indicated? No . Depo-Provera 150 mg IM given by: Higinio Love, CMA. Site: Left Deltoid   Assessment:   1. Encounter for surveillance of injectable contraceptive      Plan:   Advised she is overdue for her annual and in order to continue with depo she needs to have her annual done. Annual scheduled on 06/17/23. Next appointment due between July 2  and July 16.    Higinio Love, CMA

## 2023-05-26 NOTE — Patient Instructions (Signed)

## 2023-06-16 NOTE — Progress Notes (Deleted)
 PCP:  [facility], [facility]   No chief complaint on file.    HPI:      [name] is a 28 y.o. G1P1001 whose LMP was No LMP recorded. Patient has had an injection., presents today for her annual examination.  Her menses are absent due to depo.   Sex activity: {sex active: 315163}.  Last Pap: 06/23/19 Results were: no abnormalities  Hx of STDs: trichomonas H xof BV in past  There is no FH of breast cancer. There is no FH of ovarian cancer. The patient {does:18564} do self-breast exams.  Tobacco use: {tob:20664} Alcohol use: {Alcohol:11675} No drug use.  Exercise: {exercise:31265}  She {does:18564} get adequate calcium and Vitamin D in her diet.  Patient Active Problem List   Diagnosis Date Noted   S/P cesarean section 12/22/2019   Postpartum care following cesarean delivery 12/18/2019   Single liveborn infant, delivered by cesarean 12/18/2019   Encounter for care or examination of lactating mother 12/18/2019   [redacted] weeks gestation of pregnancy 12/16/2019   Renal pelviectasis 08/20/2019   Obesity complicating pregnancy, third trimester 06/23/2019    Past Surgical History:  Procedure Laterality Date   CESAREAN SECTION  12/16/2019   Procedure: CESAREAN SECTION;  Surgeon: Alben Alma, MD;  Location: [facility] ORS;  Service: Obstetrics;;   TONSILLECTOMY      Family History  Problem Relation Age of Onset   Colon cancer Maternal Grandfather     Social History   Socioeconomic History   Marital status: Single    Spouse name: Not on file   Number of children: Not on file   Years of education: Not on file   Highest education level: Not on file  Occupational History   Not on file  Tobacco Use   Smoking status: Every Day    Current packs/day: 0.25    Types: Cigarettes   Smokeless tobacco: Never  Vaping Use   Vaping status: Some Days  Substance and Sexual Activity   Alcohol use: Yes    Comment: soc   Drug use: Never   Sexual activity: Yes     Birth control/protection: Injection, Condom  Other Topics Concern   Not on file  Social History Narrative   Not on file   Social Drivers of Health   Financial Resource Strain: Medium Risk (06/12/2021)   Received [facility], [facility]   Overall Financial Resource Strain (CARDIA)    Difficulty of Paying Living Expenses: Somewhat hard  Food Insecurity: No Food Insecurity (06/12/2021)   Received from [facility], [facility]   Hunger Vital Sign    Worried About Running Out of Food in the Last Year: Never true    Ran Out of Food in the Last Year: Never true  Transportation Needs: No Transportation Needs (06/12/2021)   Received from [facility], [facility]   [facility] - Transportation    Lack of Transportation (Medical): No    Lack of Transportation (Non-Medical): No  Physical Activity: Not on file  Stress: Not on file  Social Connections: Not on file  Intimate Partner Violence: Not on file     Current Outpatient Medications:    medroxyPROGESTERone  (DEPO-PROVERA ) 150 MG/ML injection, Inject 1 mL (150 mg total) into the muscle every 3 (three) months for 1 dose., Disp: 1 mL, Rfl: 0   norgestimate -ethinyl estradiol  (ESTARYLLA) 0.25-35 MG-MCG tablet, Take 1 tablet by mouth daily. (Patient not taking: Reported on 05/26/2023), Disp:  28 tablet, Rfl: 1   valACYclovir  (VALTREX ) 1000 MG tablet, Take 0.5 tablets (500 mg total) by mouth 2 (two) times daily., Disp: 60 tablet, Rfl: 1     ROS:  Review of Systems BREAST: No symptoms   Objective: There were no vitals taken for this visit.   OBGyn Exam  Results: No results found for this or any previous visit (from the past 24 hours).  Assessment/Plan: No diagnosis found.  No orders of the defined types were placed in this encounter.            GYN counsel {counseling: 16159}     F/U  No follow-ups on file.  Ruhani Umland B. Justin Meisenheimer, PA-C 06/16/2023 9:01 PM

## 2023-06-17 ENCOUNTER — Ambulatory Visit: Admitting: Obstetrics and Gynecology

## 2023-06-17 DIAGNOSIS — Z3042 Encounter for surveillance of injectable contraceptive: Secondary | ICD-10-CM

## 2023-06-17 DIAGNOSIS — Z124 Encounter for screening for malignant neoplasm of cervix: Secondary | ICD-10-CM

## 2023-06-17 DIAGNOSIS — Z113 Encounter for screening for infections with a predominantly sexual mode of transmission: Secondary | ICD-10-CM

## 2023-06-17 DIAGNOSIS — Z01419 Encounter for gynecological examination (general) (routine) without abnormal findings: Secondary | ICD-10-CM

## 2023-06-22 ENCOUNTER — Encounter: Payer: Self-pay | Admitting: Obstetrics and Gynecology

## 2023-08-18 ENCOUNTER — Ambulatory Visit

## 2023-08-18 NOTE — Progress Notes (Unsigned)
 PCP:  Center, [facility]   No chief complaint on file.    HPI:      [name] is a 28 y.o. G1P1001 whose LMP was No LMP recorded. Patient has had an injection., presents today for her annual examination.  Her menses are absent with depo,   Sex activity: {sex active: 315163}.  Last Pap: 06/23/19 Results were: no abnormalities  Hx of STDs: trichomonas Hx of BV on culture  There is no FH of breast cancer. There is no FH of ovarian cancer. The patient {does:18564} do self-breast exams.  Tobacco use: {tob:20664} Alcohol use: {Alcohol:11675} No drug use.  Exercise: {exercise:31265}  She {does:18564} get adequate calcium and Vitamin D in her diet.  Patient Active Problem List   Diagnosis Date Noted   S/P cesarean section 12/22/2019   Postpartum care following cesarean delivery 12/18/2019   Single liveborn infant, delivered by cesarean 12/18/2019   Encounter for care or examination of lactating mother 12/18/2019   [redacted] weeks gestation of pregnancy 12/16/2019   Renal pelviectasis 08/20/2019   Obesity complicating pregnancy, third trimester 06/23/2019    Past Surgical History:  Procedure Laterality Date   CESAREAN SECTION  12/16/2019   Procedure: CESAREAN SECTION;  Surgeon: Arloa Lamar SQUIBB, MD;  Location: [facility] ORS;  Service: Obstetrics;;   TONSILLECTOMY      Family History  Problem Relation Age of Onset   Colon cancer Maternal Grandfather     Social History   Socioeconomic History   Marital status: Single    Spouse name: Not on file   Number of children: Not on file   Years of education: Not on file   Highest education level: Not on file  Occupational History   Not on file  Tobacco Use   Smoking status: Every Day    Current packs/day: 0.25    Types: Cigarettes   Smokeless tobacco: Never  Vaping Use   Vaping status: Some Days  Substance and Sexual Activity   Alcohol use: Yes    Comment: soc   Drug use: Never   Sexual activity: Yes     Birth control/protection: Injection, Condom  Other Topics Concern   Not on file  Social History Narrative   Not on file   Social Drivers of Health   Financial Resource Strain: Medium Risk (06/12/2021)   Received from [facility]   Overall Financial Resource Strain (CARDIA)    Difficulty of Paying Living Expenses: Somewhat hard  Food Insecurity: No Food Insecurity (06/12/2021)   Received from [facility]   Hunger Vital Sign    Within the past 12 months, you worried that your food would run out before you got the money to buy more.: Never true    Within the past 12 months, the food you bought just didn't last and you didn't have money to get more.: Never true  Transportation Needs: No Transportation Needs (06/12/2021)   Received from [facility]   [facility] - Transportation    Lack of Transportation (Medical): No    Lack of Transportation (Non-Medical): No  Physical Activity: Not on file  Stress: Not on file  Social Connections: Not on file  Intimate Partner Violence: Not on file     Current Outpatient Medications:    medroxyPROGESTERone  (DEPO-PROVERA ) 150 MG/ML injection, Inject 1 mL (150 mg total) into the muscle every 3 (three) months for 1 dose., Disp: 1 mL, Rfl: 0   norgestimate -ethinyl estradiol  (ESTARYLLA) 0.25-35 MG-MCG tablet,  Take 1 tablet by mouth daily. (Patient not taking: Reported on 05/26/2023), Disp: 28 tablet, Rfl: 1   valACYclovir  (VALTREX ) 1000 MG tablet, Take 0.5 tablets (500 mg total) by mouth 2 (two) times daily., Disp: 60 tablet, Rfl: 1     ROS:  Review of Systems BREAST: No symptoms   Objective: There were no vitals taken for this visit.   OBGyn Exam  Results: No results found for this or any previous visit (from the past 24 hours).  Assessment/Plan: No diagnosis found.  No orders of the defined types were placed in this encounter.            GYN counsel {counseling: 16159}     F/U  No follow-ups on file.  Kayren Holck B. Albirtha Grinage,  PA-C 08/18/2023 6:55 PM

## 2023-08-19 ENCOUNTER — Other Ambulatory Visit (HOSPITAL_COMMUNITY)
Admission: RE | Admit: 2023-08-19 | Discharge: 2023-08-19 | Disposition: A | Discharge status: Home / Self Care | Source: Ambulatory Visit | Attending: Obstetrics and Gynecology | Admitting: Obstetrics and Gynecology

## 2023-08-19 ENCOUNTER — Ambulatory Visit: Admitting: Obstetrics and Gynecology

## 2023-08-19 ENCOUNTER — Encounter: Payer: Self-pay | Admitting: Obstetrics and Gynecology

## 2023-08-19 VITALS — BP 138/93 | HR 86 | Ht 65.0 in | Wt 226.0 lb

## 2023-08-19 DIAGNOSIS — Z3042 Encounter for surveillance of injectable contraceptive: Secondary | ICD-10-CM | POA: Diagnosis not present

## 2023-08-19 DIAGNOSIS — Z01419 Encounter for gynecological examination (general) (routine) without abnormal findings: Secondary | ICD-10-CM

## 2023-08-19 DIAGNOSIS — L02818 Cutaneous abscess of other sites: Secondary | ICD-10-CM

## 2023-08-19 DIAGNOSIS — Z01411 Encounter for gynecological examination (general) (routine) with abnormal findings: Secondary | ICD-10-CM

## 2023-08-19 DIAGNOSIS — Z113 Encounter for screening for infections with a predominantly sexual mode of transmission: Secondary | ICD-10-CM | POA: Insufficient documentation

## 2023-08-19 DIAGNOSIS — Z124 Encounter for screening for malignant neoplasm of cervix: Secondary | ICD-10-CM

## 2023-08-19 DIAGNOSIS — Z1272 Encounter for screening for malignant neoplasm of vagina: Secondary | ICD-10-CM | POA: Diagnosis not present

## 2023-08-19 MED ORDER — MEDROXYPROGESTERONE ACETATE 150 MG/ML IM SUSP: 150.0000 mg | INTRAMUSCULAR | Status: AC

## 2023-08-19 MED ORDER — MEDROXYPROGESTERONE ACETATE 150 MG/ML IM SUSY
150.0000 mg | PREFILLED_SYRINGE | Freq: Once | INTRAMUSCULAR | Status: AC
  Administered 2023-08-19: 150 mg via INTRAMUSCULAR

## 2023-08-19 MED ORDER — DOXYCYCLINE HYCLATE 100 MG PO CAPS: 100.0000 mg | ORAL_CAPSULE | Freq: Two times a day (BID) | ORAL | 0 refills | Status: AC

## 2023-08-19 NOTE — Patient Instructions (Signed)
 I value your feedback and you entrusting [place] with your care. If you get a [place] patient survey, I would appreciate you taking the time to let us know about your experience today. Thank you! ? ? ?

## 2023-08-24 LAB — CYTOLOGY - PAP
Adequacy: ABSENT
Chlamydia: NEGATIVE
Comment: NEGATIVE
Comment: NEGATIVE
Comment: NORMAL
Diagnosis: NEGATIVE
Neisseria Gonorrhea: NEGATIVE
Trichomonas: NEGATIVE

## 2023-08-25 ENCOUNTER — Telehealth: Payer: Self-pay

## 2023-08-25 NOTE — Telephone Encounter (Signed)
 Pt calling triage regarding pap smear results. Advised pap normal suggesting BV. Pt states she does not have any vaginal sx but if Rx for BV could be sent if you thought she should take it.

## 2023-08-26 NOTE — Telephone Encounter (Signed)
 In the absence of sx, no tx is needed. Pap smears are not definitive way to dx BV.

## 2023-08-27 NOTE — Telephone Encounter (Signed)
 Called pt, no answer, LVMTRC.

## 2023-08-30 NOTE — Telephone Encounter (Signed)
 Pt aware. Will follow up as needed for a self swab.

## 2023-11-11 ENCOUNTER — Ambulatory Visit

## 2023-11-11 NOTE — Progress Notes (Unsigned)
 No show

## 2023-11-17 ENCOUNTER — Ambulatory Visit (INDEPENDENT_AMBULATORY_CARE_PROVIDER_SITE_OTHER)

## 2023-11-17 VITALS — BP 134/94 | HR 71 | Ht 65.0 in | Wt 228.4 lb

## 2023-11-17 DIAGNOSIS — Z3042 Encounter for surveillance of injectable contraceptive: Secondary | ICD-10-CM | POA: Diagnosis not present

## 2023-11-17 MED ORDER — MEDROXYPROGESTERONE ACETATE 150 MG/ML IM SUSP
150.0000 mg | Freq: Once | INTRAMUSCULAR | Status: AC
  Administered 2023-11-17: 150 mg via INTRAMUSCULAR

## 2023-11-17 NOTE — Progress Notes (Signed)
    NURSE VISIT NOTE  Subjective:    Patient ID: KAISY SEVERINO, female    DOB: 04/28/1995, 28 y.o.   MRN: [id number]  HPI  Patient is a 28 y.o. G20P1001 femaleG20P1001 female who presents for depo provera  injection.   Objective:    BP (!) 134/94   Pulse 71   Ht 5' 5 (1.651 m)   Wt 228 lb 6.4 oz (103.6 kg)   BMI 38.01 kg/m   Last Annual: 08/19/23. Last pap: 08/19/23. Last Depo-Provera : 08/19/23. Side Effects if any: n/a. Serum HCG indicated? No . Depo-Provera  150 mg IM given by: Waddell Maxim, CMA. Site: Left Deltoid   Assessment:   1. Encounter for Depo-Provera  contraception      Plan:   Next appointment due between 02/02/24 and 02/16/24.    Waddell JONELLE Maxim, CMA

## 2024-02-11 ENCOUNTER — Ambulatory Visit

## 2024-02-11 VITALS — BP 122/87 | HR 84 | Resp 16 | Ht 65.0 in | Wt 230.0 lb

## 2024-02-11 DIAGNOSIS — Z3042 Encounter for surveillance of injectable contraceptive: Secondary | ICD-10-CM

## 2024-02-11 MED ORDER — MEDROXYPROGESTERONE ACETATE 150 MG/ML IM SUSP
150.0000 mg | Freq: Once | INTRAMUSCULAR | Status: AC
  Administered 2024-02-11: 150 mg via INTRAMUSCULAR

## 2024-02-11 NOTE — Progress Notes (Signed)
" ° ° °  NURSE VISIT NOTE  Subjective:    Patient ID: Maria Raymond, female    DOB: 12-15-1995, 29 y.o.   MRN: [id number]  HPI  Patient is a 29 y.o. G29P1001 female who presents for depo provera  injection.   Objective:    Ht 5' 5 (1.651 m)   BMI 38.01 kg/m   Last Annual: 08/19/23. Last pap: 08/19/23. Last Depo-Provera : 11/17/2023. Side Effects if any: none. Serum HCG indicated? No . Depo-Provera  150 mg IM given by: Camelia Fetters, CMA. Site: Right Deltoid  Lab Review  No results found for any visits on 02/11/24.  Assessment:   1. Encounter for surveillance of injectable contraceptive      Plan:   Next appointment due between March 20 and April 3.    Camelia Fetters, CMA [facility]

## 2024-02-11 NOTE — Patient Instructions (Signed)

## 2024-05-12 ENCOUNTER — Ambulatory Visit
